# Patient Record
Sex: Female | Born: 1935 | Race: White | Hispanic: No | Smoking: Smoker, current status unknown
Health system: Southern US, Community
[De-identification: ages and names within clinical notes are randomized; demographics above are authoritative.]

## PROBLEM LIST (undated history)

## (undated) DIAGNOSIS — E7800 Pure hypercholesterolemia, unspecified: Secondary | ICD-10-CM

## (undated) DIAGNOSIS — Z720 Tobacco use: Secondary | ICD-10-CM

## (undated) DIAGNOSIS — I517 Cardiomegaly: Secondary | ICD-10-CM

## (undated) DIAGNOSIS — N8189 Other female genital prolapse: Secondary | ICD-10-CM

## (undated) DIAGNOSIS — H409 Unspecified glaucoma: Secondary | ICD-10-CM

## (undated) DIAGNOSIS — L409 Psoriasis, unspecified: Secondary | ICD-10-CM

## (undated) DIAGNOSIS — J449 Chronic obstructive pulmonary disease, unspecified: Secondary | ICD-10-CM

## (undated) DIAGNOSIS — I10 Essential (primary) hypertension: Secondary | ICD-10-CM

## (undated) HISTORY — DX: Essential (primary) hypertension: I10

## (undated) HISTORY — DX: Cardiomegaly: I51.7

## (undated) HISTORY — DX: Chronic obstructive pulmonary disease, unspecified: J44.9

## (undated) HISTORY — DX: Other female genital prolapse: N81.89

## (undated) HISTORY — DX: Psoriasis, unspecified: L40.9

## (undated) HISTORY — DX: Pure hypercholesterolemia, unspecified: E78.00

## (undated) HISTORY — DX: Tobacco use: Z72.0

## (undated) HISTORY — PX: OOPHORECTOMY: SHX86

## (undated) HISTORY — PX: TONSILLECTOMY: SUR1361

---

## 1999-04-13 ENCOUNTER — Other Ambulatory Visit: Admission: RE | Admit: 1999-04-13 | Discharge: 1999-04-13 | Payer: Self-pay | Admitting: Cardiology

## 2000-10-09 ENCOUNTER — Other Ambulatory Visit: Admission: RE | Admit: 2000-10-09 | Discharge: 2000-10-09 | Payer: Self-pay | Admitting: Cardiology

## 2005-12-21 ENCOUNTER — Other Ambulatory Visit: Admission: RE | Admit: 2005-12-21 | Discharge: 2005-12-21 | Payer: Self-pay | Admitting: *Deleted

## 2009-01-29 ENCOUNTER — Encounter: Admission: RE | Admit: 2009-01-29 | Discharge: 2009-01-29 | Payer: Self-pay | Admitting: Cardiology

## 2009-05-03 ENCOUNTER — Encounter: Admission: RE | Admit: 2009-05-03 | Discharge: 2009-05-03 | Payer: Self-pay | Admitting: Obstetrics and Gynecology

## 2009-07-06 ENCOUNTER — Encounter (INDEPENDENT_AMBULATORY_CARE_PROVIDER_SITE_OTHER): Payer: Self-pay | Admitting: Obstetrics and Gynecology

## 2009-07-06 ENCOUNTER — Ambulatory Visit (HOSPITAL_COMMUNITY): Admission: RE | Admit: 2009-07-06 | Discharge: 2009-07-07 | Payer: Self-pay | Admitting: Obstetrics and Gynecology

## 2009-10-28 ENCOUNTER — Ambulatory Visit: Payer: Self-pay | Admitting: Cardiology

## 2010-05-02 ENCOUNTER — Institutional Professional Consult (permissible substitution) (INDEPENDENT_AMBULATORY_CARE_PROVIDER_SITE_OTHER): Payer: Medicare Other | Admitting: Cardiology

## 2010-05-02 DIAGNOSIS — I119 Hypertensive heart disease without heart failure: Secondary | ICD-10-CM

## 2010-05-02 DIAGNOSIS — Z79899 Other long term (current) drug therapy: Secondary | ICD-10-CM

## 2010-05-02 DIAGNOSIS — E7800 Pure hypercholesterolemia, unspecified: Secondary | ICD-10-CM

## 2010-05-04 ENCOUNTER — Other Ambulatory Visit: Payer: Self-pay | Admitting: Cardiology

## 2010-05-04 ENCOUNTER — Ambulatory Visit
Admission: RE | Admit: 2010-05-04 | Discharge: 2010-05-04 | Disposition: A | Discharge status: Home / Self Care | Payer: Medicare Other | Source: Ambulatory Visit | Attending: Cardiology | Admitting: Cardiology

## 2010-05-04 DIAGNOSIS — R05 Cough: Secondary | ICD-10-CM

## 2010-06-15 LAB — COMPREHENSIVE METABOLIC PANEL
AST: 24 U/L (ref 0–37)
Albumin: 3.8 g/dL (ref 3.5–5.2)
BUN: 20 mg/dL (ref 6–23)
CO2: 30 mEq/L (ref 19–32)
Calcium: 9.2 mg/dL (ref 8.4–10.5)
GFR calc Af Amer: >60 mL/min (ref >60)
Glucose, Bld: 80 mg/dL (ref 70–99)
Sodium: 137 mEq/L (ref 135–145)
Total Bilirubin: 0.7 mg/dL (ref 0.3–1.2)
Total Protein: 6.7 g/dL (ref 6.0–8.3)

## 2010-06-15 LAB — CBC
HCT: 28.6 % — ABNORMAL LOW (ref 36.0–46.0)
Hemoglobin: 12.6 g/dL (ref 12.0–15.0)
MCHC: 34.1 g/dL (ref 30.0–36.0)
MCHC: 34.5 g/dL (ref 30.0–36.0)
MCV: 95.1 fL (ref 78.0–100.0)
MCV: 95.3 fL (ref 78.0–100.0)
Platelets: 159 10*3/uL (ref 150–400)
RBC: 3.88 MIL/uL (ref 3.87–5.11)
RDW: 13 % (ref 11.5–15.5)
WBC: 11.3 10*3/uL — ABNORMAL HIGH (ref 4.0–10.5)
WBC: 7.6 10*3/uL (ref 4.0–10.5)

## 2010-06-15 LAB — BASIC METABOLIC PANEL
CO2: 28 mEq/L (ref 19–32)
Calcium: 8.1 mg/dL — ABNORMAL LOW (ref 8.4–10.5)
Chloride: 101 mEq/L (ref 96–112)
Creatinine, Ser: 0.83 mg/dL (ref 0.4–1.2)
GFR calc Af Amer: >60 mL/min (ref >60)
Glucose, Bld: 124 mg/dL — ABNORMAL HIGH (ref 70–99)
Potassium: 3.8 mEq/L (ref 3.5–5.1)
Sodium: 134 mEq/L — ABNORMAL LOW (ref 135–145)

## 2010-06-15 LAB — URINALYSIS, ROUTINE W REFLEX MICROSCOPIC
Glucose, UA: NEGATIVE mg/dL
Protein, ur: NEGATIVE mg/dL
pH: 7 (ref 5.0–8.0)

## 2010-06-15 LAB — URINE MICROSCOPIC-ADD ON

## 2010-06-15 LAB — PROTIME-INR: INR: 1 (ref 0.00–1.49)

## 2010-07-26 ENCOUNTER — Telehealth: Payer: Self-pay | Admitting: Cardiology

## 2010-07-26 NOTE — Telephone Encounter (Signed)
CALL PT ABOUT HER MEDS. CHART PLACED IN BOX.

## 2010-07-27 NOTE — Telephone Encounter (Signed)
Patient currently taking generic maxzide 37.5/25 mg 1/2 daily however it is available from [facility].  Advised that it is ok for her to take the generic dyazide caps 37.5/25 mg 1 daily.  Advised if she developed any lightheadedness or dizziness to let us know.

## 2010-07-28 NOTE — Telephone Encounter (Signed)
Agree with plan 

## 2010-07-28 NOTE — Telephone Encounter (Signed)
Typed available in error, current dose is unavailable

## 2010-08-17 ENCOUNTER — Other Ambulatory Visit: Payer: Self-pay | Admitting: Cardiology

## 2010-08-17 DIAGNOSIS — I10 Essential (primary) hypertension: Secondary | ICD-10-CM

## 2010-08-17 NOTE — Telephone Encounter (Signed)
escribe request  

## 2010-12-15 ENCOUNTER — Encounter: Payer: Self-pay | Admitting: Cardiology

## 2010-12-27 ENCOUNTER — Encounter: Payer: Self-pay | Admitting: Cardiology

## 2010-12-27 ENCOUNTER — Ambulatory Visit (INDEPENDENT_AMBULATORY_CARE_PROVIDER_SITE_OTHER): Payer: Medicare Other | Admitting: Cardiology

## 2010-12-27 VITALS — BP 137/77 | HR 64 | Ht 61.0 in | Wt 115.0 lb

## 2010-12-27 DIAGNOSIS — Z716 Tobacco abuse counseling: Secondary | ICD-10-CM

## 2010-12-27 DIAGNOSIS — R002 Palpitations: Secondary | ICD-10-CM

## 2010-12-27 DIAGNOSIS — F17200 Nicotine dependence, unspecified, uncomplicated: Secondary | ICD-10-CM

## 2010-12-27 DIAGNOSIS — L409 Psoriasis, unspecified: Secondary | ICD-10-CM

## 2010-12-27 DIAGNOSIS — E7800 Pure hypercholesterolemia, unspecified: Secondary | ICD-10-CM

## 2010-12-27 DIAGNOSIS — Z720 Tobacco use: Secondary | ICD-10-CM

## 2010-12-27 DIAGNOSIS — Z7189 Other specified counseling: Secondary | ICD-10-CM

## 2010-12-27 DIAGNOSIS — L408 Other psoriasis: Secondary | ICD-10-CM

## 2010-12-27 DIAGNOSIS — I119 Hypertensive heart disease without heart failure: Secondary | ICD-10-CM

## 2010-12-27 LAB — LIPID PANEL: HDL: 73.5 mg/dL (ref >39.00)

## 2010-12-27 LAB — BASIC METABOLIC PANEL
BUN: 19 mg/dL (ref 6–23)
CO2: 31 mEq/L (ref 19–32)
Calcium: 9.4 mg/dL (ref 8.4–10.5)
Chloride: 101 mEq/L (ref 96–112)
GFR: 60.64 mL/min (ref >60.00)

## 2010-12-27 LAB — HEPATIC FUNCTION PANEL
Albumin: 4 g/dL (ref 3.5–5.2)
Alkaline Phosphatase: 47 U/L (ref 39–117)
Bilirubin, Direct: 0.1 mg/dL (ref 0.0–0.3)
Total Bilirubin: 0.7 mg/dL (ref 0.3–1.2)
Total Protein: 6.8 g/dL (ref 6.0–8.3)

## 2010-12-27 NOTE — Progress Notes (Signed)
Jenny Sexton Date of Birth:  [birth year] [facility] / [employer] [address]   [address] [place], [place]  [address] [phone]           Fax   [phone]  History of Present Illness: This pleasant 77 year old woman is seen for a scheduled six-month followup office visit.  Past history of essential hypertension, ongoing cigarette abuse, and occasional noncardiac chest pain.  She also has a history of psoriasis and hypercholesterolemia.  She does not have any known ischemic heart disease, and she had a normal Cardiolite stress test in January 2001.  Current Outpatient Prescriptions  Medication Sig Dispense Refill  . aspirin 81 MG tablet Take 81 mg by mouth daily. 3 TIMES A WEEK       . atenolol (TENORMIN) 50 MG tablet TAKE 1 TABLET DAILY  90 tablet  3  . atorvastatin (LIPITOR) 10 MG tablet Take 10 mg by mouth daily.        . brimonidine (ALPHAGAN P) 0.1 % SOLN 1 drop both eyes twice a day       . Calcium Carbonate-Vitamin D (CALTRATE 600+D PO) Take by mouth.        . dorzolamide-timolol (COSOPT) 22.3-6.8 MG/ML ophthalmic solution 1 drop 2 (two) times daily.        . Travoprost, BAK Free, (TRAVATAMN) 0.004 % SOLN ophthalmic solution 1 drop at bedtime.        . triamterene-hydrochlorothiazide (DYAZIDE) 37.5-25 MG per capsule 1 tab daily        Allergies  Allergen Reactions  . Amoxicillin     There is no problem list on file for this patient.   History  Smoking status  . Smoker, Current Status Unknown -- 1.0 packs/day  Smokeless tobacco  . Not on file    History  Alcohol Use     Family History  Problem Relation Age of Onset  . Heart attack Father   . Cancer Mother     Review of Systems: Constitutional: no fever chills diaphoresis or fatigue or change in weight.  Head and neck: no hearing loss, no epistaxis, no photophobia or visual disturbance. Respiratory: No cough, shortness of breath or wheezing. Cardiovascular: No chest pain peripheral edema,  palpitations. Gastrointestinal: No abdominal distention, no abdominal pain, no change in bowel habits hematochezia or melena. Genitourinary: No dysuria, no frequency, no urgency, no nocturia. Musculoskeletal:No arthralgias, no back pain, no gait disturbance or myalgias. Neurological: No dizziness, no headaches, no numbness, no seizures, no syncope, no weakness, no tremors. Hematologic: No lymphadenopathy, no easy bruising. Psychiatric: No confusion, no hallucinations, no sleep disturbance.    Physical Exam: Filed Vitals:   12/27/10 0947  BP: 137/77  Pulse: 64   general appearance reveals a well-developed, well-nourished woman in no acute distress.Pupils equal and reactive.   Extraocular Movements are full.  There is no scleral icterus.  The mouth and pharynx are normal.  The neck is supple.  The carotids reveal no bruits.  The jugular venous pressure is normal.  The thyroid is not enlarged.  There is no lymphadenopathy.  The chest is clear to percussion and auscultation. There are no rales or rhonchi. Expansion of the chest is symmetrical.  The precordium is quiet.  The first heart sound is normal.  The second heart sound is physiologically split.  There is no murmur gallop rub or click.  There is no abnormal lift or heave.  The abdomen is soft and nontender. Bowel sounds are normal. The liver  and spleen are not enlarged. There Are no abdominal masses. There are no bruits.  The pedal pulses are good.  There is no phlebitis or edema.  There is no cyanosis or clubbing. Integument reveals changes of psoriasis, which are stableStrength is normal and symmetrical in all extremities.  There is no lateralizing weakness.  There are no sensory deficits.     Assessment / Plan:  Continue same medication.  Blood work today pending.  Recheck in 6 months for followup office visit and EKG and fasting lab work

## 2010-12-27 NOTE — Assessment & Plan Note (Signed)
Patient is tolerating low dose  10 milligrams daily.  She is not having myalgias from the statin therapy.  We are checking lab work today

## 2010-12-27 NOTE — Assessment & Plan Note (Signed)
Since last visit, the patient has been feeling well.  She has not been experiencing any headaches or dizzy spells.  She denies any chest pain or shortness of breath.  Is not on any palpitations.

## 2010-12-27 NOTE — Assessment & Plan Note (Signed)
We talked to her today about the importance of quitting smoking.  She has cut down slightly, but has still not made a severe effort to quit.  He does have a slight morning smoker's cough, which is nonproductive.  Her last chest x-ray was 05/04/10 showing stable.  Emphysematous changes and small calcified granulomas, but no acute pulmonary findings and her cardiac silhouette was normal

## 2010-12-27 NOTE — Patient Instructions (Addendum)
We will check your cholesterol and your liver function studies and your electrolytes, today.  I recommend that you obtain some Biotin over the counter to take for hair loss  Need to quit smoking

## 2010-12-29 ENCOUNTER — Telehealth: Payer: Self-pay | Admitting: *Deleted

## 2010-12-29 NOTE — Telephone Encounter (Signed)
Advised of labs 

## 2010-12-29 NOTE — Progress Notes (Signed)
Advised patient

## 2010-12-29 NOTE — Telephone Encounter (Signed)
Message copied by Burnell Blanks on Thu Dec 29, 2010  4:54 PM ------      Message from: Cassell Clement      Created: Thu Dec 29, 2010  3:28 PM       Please report. LFTs nl.  BS 104 better.      LP normal.  CSD

## 2011-04-10 DIAGNOSIS — Z1231 Encounter for screening mammogram for malignant neoplasm of breast: Secondary | ICD-10-CM | POA: Diagnosis not present

## 2011-04-27 ENCOUNTER — Other Ambulatory Visit: Payer: Self-pay | Admitting: *Deleted

## 2011-04-27 MED ORDER — ATORVASTATIN CALCIUM 10 MG PO TABS: 10.0000 mg | ORAL_TABLET | Freq: Every day | ORAL | Status: DC

## 2011-04-28 ENCOUNTER — Ambulatory Visit (INDEPENDENT_AMBULATORY_CARE_PROVIDER_SITE_OTHER): Payer: Medicare Other | Admitting: *Deleted

## 2011-04-28 DIAGNOSIS — I119 Hypertensive heart disease without heart failure: Secondary | ICD-10-CM | POA: Diagnosis not present

## 2011-04-28 DIAGNOSIS — E7800 Pure hypercholesterolemia, unspecified: Secondary | ICD-10-CM | POA: Diagnosis not present

## 2011-04-28 LAB — CBC WITH DIFFERENTIAL/PLATELET
Basophils Absolute: 0 10*3/uL (ref 0.0–0.1)
Basophils Relative: 0.6 % (ref 0.0–3.0)
Eosinophils Relative: 4.2 % (ref 0.0–5.0)
HCT: 35.2 % — ABNORMAL LOW (ref 36.0–46.0)
Hemoglobin: 12 g/dL (ref 12.0–15.0)
Lymphs Abs: 1.5 10*3/uL (ref 0.7–4.0)
MCHC: 34.2 g/dL (ref 30.0–36.0)
Monocytes Absolute: 0.6 10*3/uL (ref 0.1–1.0)
Neutro Abs: 3.6 10*3/uL (ref 1.4–7.7)
Platelets: 232 10*3/uL (ref 150.0–400.0)
WBC: 6 10*3/uL (ref 4.5–10.5)

## 2011-04-28 LAB — BASIC METABOLIC PANEL
Calcium: 9.2 mg/dL (ref 8.4–10.5)
GFR: 58.45 mL/min — ABNORMAL LOW (ref >60.00)
Potassium: 3.7 mEq/L (ref 3.5–5.1)
Sodium: 137 mEq/L (ref 135–145)

## 2011-04-28 LAB — HEPATIC FUNCTION PANEL
AST: 25 U/L (ref 0–37)
Albumin: 3.8 g/dL (ref 3.5–5.2)
Bilirubin, Direct: 0 mg/dL (ref 0.0–0.3)
Total Bilirubin: 0.6 mg/dL (ref 0.3–1.2)

## 2011-04-28 LAB — LIPID PANEL
HDL: 57.1 mg/dL (ref >39.00)
LDL Cholesterol: 76 mg/dL (ref 0–99)
Total CHOL/HDL Ratio: 3
Triglycerides: 113 mg/dL (ref 0.0–149.0)

## 2011-05-01 ENCOUNTER — Telehealth: Payer: Self-pay | Admitting: *Deleted

## 2011-05-01 NOTE — Telephone Encounter (Signed)
Advised of labs 

## 2011-05-01 NOTE — Telephone Encounter (Signed)
Message copied by Burnell Blanks on Mon May 01, 2011  9:17 AM ------      Message from: Cassell Clement      Created: Mon May 01, 2011  6:04 AM       Please report.  The labs are stable.  Continue same meds.  Continue careful diet.

## 2011-05-18 DIAGNOSIS — N812 Incomplete uterovaginal prolapse: Secondary | ICD-10-CM | POA: Diagnosis not present

## 2011-06-21 ENCOUNTER — Other Ambulatory Visit: Payer: Self-pay | Admitting: *Deleted

## 2011-06-21 DIAGNOSIS — I10 Essential (primary) hypertension: Secondary | ICD-10-CM

## 2011-06-21 MED ORDER — TRIAMTERENE-HCTZ 37.5-25 MG PO CAPS: 1.0000 | ORAL_CAPSULE | Freq: Every day | ORAL | Status: DC

## 2011-06-21 MED ORDER — ATENOLOL 50 MG PO TABS: 50.0000 mg | ORAL_TABLET | Freq: Every day | ORAL | Status: DC

## 2011-06-26 ENCOUNTER — Other Ambulatory Visit: Payer: Self-pay | Admitting: *Deleted

## 2011-07-10 DIAGNOSIS — H2510 Age-related nuclear cataract, unspecified eye: Secondary | ICD-10-CM | POA: Diagnosis not present

## 2011-07-10 DIAGNOSIS — H4011X0 Primary open-angle glaucoma, stage unspecified: Secondary | ICD-10-CM | POA: Diagnosis not present

## 2011-07-13 ENCOUNTER — Encounter: Payer: Self-pay | Admitting: Cardiology

## 2011-07-13 ENCOUNTER — Ambulatory Visit (INDEPENDENT_AMBULATORY_CARE_PROVIDER_SITE_OTHER): Payer: Medicare Other | Admitting: Cardiology

## 2011-07-13 ENCOUNTER — Ambulatory Visit (INDEPENDENT_AMBULATORY_CARE_PROVIDER_SITE_OTHER)
Admission: RE | Admit: 2011-07-13 | Discharge: 2011-07-13 | Disposition: A | Discharge status: Home / Self Care | Payer: Medicare Other | Source: Ambulatory Visit | Attending: Cardiology | Admitting: Cardiology

## 2011-07-13 VITALS — BP 120/82 | HR 55 | Ht 62.0 in | Wt 116.0 lb

## 2011-07-13 DIAGNOSIS — R05 Cough: Secondary | ICD-10-CM

## 2011-07-13 DIAGNOSIS — E7800 Pure hypercholesterolemia, unspecified: Secondary | ICD-10-CM | POA: Diagnosis not present

## 2011-07-13 DIAGNOSIS — I519 Heart disease, unspecified: Secondary | ICD-10-CM | POA: Diagnosis not present

## 2011-07-13 DIAGNOSIS — Z720 Tobacco use: Secondary | ICD-10-CM

## 2011-07-13 DIAGNOSIS — I10 Essential (primary) hypertension: Secondary | ICD-10-CM | POA: Diagnosis not present

## 2011-07-13 DIAGNOSIS — F17200 Nicotine dependence, unspecified, uncomplicated: Secondary | ICD-10-CM

## 2011-07-13 DIAGNOSIS — F411 Generalized anxiety disorder: Secondary | ICD-10-CM

## 2011-07-13 DIAGNOSIS — I119 Hypertensive heart disease without heart failure: Secondary | ICD-10-CM

## 2011-07-13 DIAGNOSIS — R0602 Shortness of breath: Secondary | ICD-10-CM | POA: Diagnosis not present

## 2011-07-13 DIAGNOSIS — F419 Anxiety disorder, unspecified: Secondary | ICD-10-CM

## 2011-07-13 MED ORDER — ALPRAZOLAM 0.25 MG PO TABS: 0.2500 mg | ORAL_TABLET | Freq: Two times a day (BID) | ORAL | Status: AC | PRN

## 2011-07-13 NOTE — Assessment & Plan Note (Signed)
The patient admits to being under a lot of emotional stress.  She would like to quit smoking.  She is requesting something mild for her nerves and we will give her a trial of Xanax 0.25 mg twice a day when necessary.

## 2011-07-13 NOTE — Patient Instructions (Signed)
Rx for Xanax called to [facility]-- one tablet twice a day as needed  Will have you go for chest xray and call you with the results  Your physician wants you to follow-up in: 6 monts You will receive a reminder letter in the mail two months in advance. If you don't receive a letter, please call our office to schedule the follow-up appointment.

## 2011-07-13 NOTE — Assessment & Plan Note (Signed)
The patient is on Lipitor 10 mg daily for her hypercholesterolemia.  Her lipids are remaining satisfactory.  She has not been expressing any myalgias.

## 2011-07-13 NOTE — Assessment & Plan Note (Signed)
Blood pressure has been staying stable on current therapy.  The patient does have exertional dyspnea which she attributes to smoking.  She also has a nonproductive cough.  We will update her chest x-ray today.

## 2011-07-13 NOTE — Progress Notes (Signed)
Jenny Sexton Date of Birth:  1932-02-04 [facility] [address] [place], [place]  [address] [phone]         Fax   [phone]  History of Present Illness: This pleasant 78 year old woman is seen for a six-month followup office visit.  She has a history of essential hypertension, hypercholesterolemia, and atypical chest pain.  She had a normal Cardiolite stress test in January 2001.  He has a chronic cough and has ongoing cigarette abuse.  As a history of psoriasis.  Since last visit she's been under a lot of stress.  Her husband who is 10 years older than she is has had a lot of serious medical issues and she is his caregiver.  Current Outpatient Prescriptions  Medication Sig Dispense Refill  . aspirin 81 MG tablet Take 81 mg by mouth.       Marland Kitchen atenolol (TENORMIN) 50 MG tablet Take 1 tablet (50 mg total) by mouth daily.  90 tablet  2  . atorvastatin (LIPITOR) 10 MG tablet Take 1 tablet (10 mg total) by mouth daily.  90 tablet  3  . brimonidine (ALPHAGAN P) 0.1 % SOLN 1 drop both eyes twice a day       . Calcium Carbonate-Vitamin D (CALTRATE 600+D PO) Take by mouth.        . dorzolamide-timolol (COSOPT) 22.3-6.8 MG/ML ophthalmic solution 1 drop 2 (two) times daily.        . Travoprost, BAK Free, (TRAVATAMN) 0.004 % SOLN ophthalmic solution 1 drop at bedtime.        . triamterene-hydrochlorothiazide (DYAZIDE) 37.5-25 MG per capsule Take 1 each (1 capsule total) by mouth daily. 1 tab daily  90 capsule  2  . ALPRAZolam (XANAX) 0.25 MG tablet Take 1 tablet (0.25 mg total) by mouth 2 (two) times daily as needed.  60 tablet  1  . doxycycline (PERIOSTAT) 20 MG tablet as directed.        Allergies  Allergen Reactions  . Amoxicillin   . Codeine     nausea    Patient Active Problem List  Diagnoses  . Hypercholesterolemia  . Benign hypertensive heart disease without heart failure  . Psoriasis  . Tobacco abuse    History  Smoking status  . Smoker, Current  Status Unknown -- 1.0 packs/day  Smokeless tobacco  . Not on file    History  Alcohol Use     Family History  Problem Relation Age of Onset  . Heart attack Father   . Cancer Mother     Review of Systems: Constitutional: no fever chills diaphoresis or fatigue or change in weight.  Head and neck: no hearing loss, no epistaxis, no photophobia or visual disturbance. Respiratory: No cough, shortness of breath or wheezing. Cardiovascular: No chest pain peripheral edema, palpitations. Gastrointestinal: No abdominal distention, no abdominal pain, no change in bowel habits hematochezia or melena. Genitourinary: No dysuria, no frequency, no urgency, no nocturia. Musculoskeletal:No arthralgias, no back pain, no gait disturbance or myalgias. Neurological: No dizziness, no headaches, no numbness, no seizures, no syncope, no weakness, no tremors. Hematologic: No lymphadenopathy, no easy bruising. Psychiatric: No confusion, no hallucinations, no sleep disturbance.    Physical Exam: Filed Vitals:   07/13/11 1022  BP: 120/82  Pulse: 55   the general appearance reveals a well-developed well-nourished woman in no distress.Pupils equal and reactive.   Extraocular Movements are full.  There is no scleral icterus.  The mouth and pharynx are normal.  The  neck is supple.  The carotids reveal no bruits.  The jugular venous pressure is normal.  The thyroid is not enlarged.  There is no lymphadenopathy.  The chest is clear to percussion and auscultation.  She does have some mild expiratory rhonchi and wheezes.The precordium is quiet.  The first heart sound is normal.  The second heart sound is physiologically split.  There is no murmur gallop rub or click.  There is no abnormal lift or heave.  The abdomen is soft and nontender. Bowel sounds are normal. The liver and spleen are not enlarged. There Are no abdominal masses. There are no bruits.  The pedal pulses are good.  There is no phlebitis or edema.   There is no cyanosis or clubbing. Strength is normal and symmetrical in all extremities.  There is no lateralizing weakness.  There are no sensory deficits.  Integument reveals skin changes of psoriasis  EKG today shows sinus bradycardia and is otherwise within normal limits  Assessment / Plan: Continue same medication.  Trial of Xanax.  We spoke to her about the need to stop smoking.  She will get chest x-ray today.  She will be seeing Dr. Timothy Lasso for her general medical care.  We will see her back here in 6 months for followup office visit and follow her lipid panel hepatic function panel and basal metabolic panel at that time

## 2011-07-14 ENCOUNTER — Telehealth: Payer: Self-pay | Admitting: *Deleted

## 2011-07-14 NOTE — Telephone Encounter (Signed)
Advised of labs 

## 2011-07-14 NOTE — Telephone Encounter (Signed)
Message copied by Burnell Blanks on Fri Jul 14, 2011  8:49 AM ------      Message from: Cassell Clement      Created: Thu Jul 13, 2011  5:47 PM       Please report.  The chest xray shows COPD again. No change from last year.

## 2011-08-23 DIAGNOSIS — N390 Urinary tract infection, site not specified: Secondary | ICD-10-CM | POA: Diagnosis not present

## 2011-08-23 DIAGNOSIS — N812 Incomplete uterovaginal prolapse: Secondary | ICD-10-CM | POA: Diagnosis not present

## 2011-09-26 DIAGNOSIS — J449 Chronic obstructive pulmonary disease, unspecified: Secondary | ICD-10-CM | POA: Diagnosis not present

## 2011-09-26 DIAGNOSIS — D649 Anemia, unspecified: Secondary | ICD-10-CM | POA: Diagnosis not present

## 2011-09-26 DIAGNOSIS — K4190 Unilateral femoral hernia, without obstruction or gangrene, not specified as recurrent: Secondary | ICD-10-CM | POA: Diagnosis not present

## 2011-09-26 DIAGNOSIS — I10 Essential (primary) hypertension: Secondary | ICD-10-CM | POA: Diagnosis not present

## 2011-09-26 DIAGNOSIS — E785 Hyperlipidemia, unspecified: Secondary | ICD-10-CM | POA: Diagnosis not present

## 2011-12-04 DIAGNOSIS — Z23 Encounter for immunization: Secondary | ICD-10-CM | POA: Diagnosis not present

## 2011-12-21 DIAGNOSIS — N812 Incomplete uterovaginal prolapse: Secondary | ICD-10-CM | POA: Diagnosis not present

## 2012-02-21 DIAGNOSIS — H4011X0 Primary open-angle glaucoma, stage unspecified: Secondary | ICD-10-CM | POA: Diagnosis not present

## 2012-02-21 DIAGNOSIS — H2510 Age-related nuclear cataract, unspecified eye: Secondary | ICD-10-CM | POA: Diagnosis not present

## 2012-04-16 DIAGNOSIS — N812 Incomplete uterovaginal prolapse: Secondary | ICD-10-CM | POA: Diagnosis not present

## 2012-04-16 DIAGNOSIS — Z1231 Encounter for screening mammogram for malignant neoplasm of breast: Secondary | ICD-10-CM | POA: Diagnosis not present

## 2012-04-24 DIAGNOSIS — Z1211 Encounter for screening for malignant neoplasm of colon: Secondary | ICD-10-CM | POA: Diagnosis not present

## 2012-07-23 DIAGNOSIS — N812 Incomplete uterovaginal prolapse: Secondary | ICD-10-CM | POA: Diagnosis not present

## 2012-07-23 DIAGNOSIS — M899 Disorder of bone, unspecified: Secondary | ICD-10-CM | POA: Diagnosis not present

## 2012-07-23 DIAGNOSIS — J069 Acute upper respiratory infection, unspecified: Secondary | ICD-10-CM | POA: Diagnosis not present

## 2012-07-23 DIAGNOSIS — R05 Cough: Secondary | ICD-10-CM | POA: Diagnosis not present

## 2012-07-23 DIAGNOSIS — I10 Essential (primary) hypertension: Secondary | ICD-10-CM | POA: Diagnosis not present

## 2012-07-23 DIAGNOSIS — R7309 Other abnormal glucose: Secondary | ICD-10-CM | POA: Diagnosis not present

## 2012-07-23 DIAGNOSIS — IMO0002 Reserved for concepts with insufficient information to code with codable children: Secondary | ICD-10-CM | POA: Diagnosis not present

## 2012-07-23 DIAGNOSIS — R82998 Other abnormal findings in urine: Secondary | ICD-10-CM | POA: Diagnosis not present

## 2012-07-23 DIAGNOSIS — J029 Acute pharyngitis, unspecified: Secondary | ICD-10-CM | POA: Diagnosis not present

## 2012-07-23 DIAGNOSIS — E785 Hyperlipidemia, unspecified: Secondary | ICD-10-CM | POA: Diagnosis not present

## 2012-07-24 DIAGNOSIS — R7309 Other abnormal glucose: Secondary | ICD-10-CM | POA: Diagnosis not present

## 2012-07-30 DIAGNOSIS — D649 Anemia, unspecified: Secondary | ICD-10-CM | POA: Diagnosis not present

## 2012-07-30 DIAGNOSIS — J069 Acute upper respiratory infection, unspecified: Secondary | ICD-10-CM | POA: Diagnosis not present

## 2012-07-30 DIAGNOSIS — K4190 Unilateral femoral hernia, without obstruction or gangrene, not specified as recurrent: Secondary | ICD-10-CM | POA: Diagnosis not present

## 2012-07-30 DIAGNOSIS — Z0000 Encounter for general adult medical examination without abnormal findings: Secondary | ICD-10-CM | POA: Diagnosis not present

## 2012-07-30 DIAGNOSIS — L408 Other psoriasis: Secondary | ICD-10-CM | POA: Diagnosis not present

## 2012-07-30 DIAGNOSIS — M899 Disorder of bone, unspecified: Secondary | ICD-10-CM | POA: Diagnosis not present

## 2012-07-30 DIAGNOSIS — Z23 Encounter for immunization: Secondary | ICD-10-CM | POA: Diagnosis not present

## 2012-07-30 DIAGNOSIS — R7309 Other abnormal glucose: Secondary | ICD-10-CM | POA: Diagnosis not present

## 2012-07-30 DIAGNOSIS — F17200 Nicotine dependence, unspecified, uncomplicated: Secondary | ICD-10-CM | POA: Diagnosis not present

## 2012-08-26 DIAGNOSIS — H4011X0 Primary open-angle glaucoma, stage unspecified: Secondary | ICD-10-CM | POA: Diagnosis not present

## 2012-08-26 DIAGNOSIS — H2510 Age-related nuclear cataract, unspecified eye: Secondary | ICD-10-CM | POA: Diagnosis not present

## 2012-11-27 DIAGNOSIS — N760 Acute vaginitis: Secondary | ICD-10-CM | POA: Diagnosis not present

## 2012-11-27 DIAGNOSIS — N8110 Cystocele, unspecified: Secondary | ICD-10-CM | POA: Diagnosis not present

## 2012-12-23 DIAGNOSIS — Z23 Encounter for immunization: Secondary | ICD-10-CM | POA: Diagnosis not present

## 2013-01-06 DIAGNOSIS — H2510 Age-related nuclear cataract, unspecified eye: Secondary | ICD-10-CM | POA: Diagnosis not present

## 2013-01-06 DIAGNOSIS — H409 Unspecified glaucoma: Secondary | ICD-10-CM | POA: Diagnosis not present

## 2013-01-06 DIAGNOSIS — H4011X0 Primary open-angle glaucoma, stage unspecified: Secondary | ICD-10-CM | POA: Diagnosis not present

## 2013-01-27 DIAGNOSIS — R809 Proteinuria, unspecified: Secondary | ICD-10-CM | POA: Diagnosis not present

## 2013-01-27 DIAGNOSIS — I10 Essential (primary) hypertension: Secondary | ICD-10-CM | POA: Diagnosis not present

## 2013-01-27 DIAGNOSIS — F17200 Nicotine dependence, unspecified, uncomplicated: Secondary | ICD-10-CM | POA: Diagnosis not present

## 2013-01-27 DIAGNOSIS — IMO0002 Reserved for concepts with insufficient information to code with codable children: Secondary | ICD-10-CM | POA: Diagnosis not present

## 2013-01-27 DIAGNOSIS — E785 Hyperlipidemia, unspecified: Secondary | ICD-10-CM | POA: Diagnosis not present

## 2013-01-27 DIAGNOSIS — M899 Disorder of bone, unspecified: Secondary | ICD-10-CM | POA: Diagnosis not present

## 2013-01-27 DIAGNOSIS — D126 Benign neoplasm of colon, unspecified: Secondary | ICD-10-CM | POA: Diagnosis not present

## 2013-01-27 DIAGNOSIS — J449 Chronic obstructive pulmonary disease, unspecified: Secondary | ICD-10-CM | POA: Diagnosis not present

## 2013-02-14 ENCOUNTER — Encounter: Payer: Self-pay | Admitting: Cardiology

## 2013-02-14 ENCOUNTER — Other Ambulatory Visit: Payer: Self-pay

## 2013-05-12 DIAGNOSIS — H4011X0 Primary open-angle glaucoma, stage unspecified: Secondary | ICD-10-CM | POA: Diagnosis not present

## 2013-05-12 DIAGNOSIS — H409 Unspecified glaucoma: Secondary | ICD-10-CM | POA: Diagnosis not present

## 2013-06-03 DIAGNOSIS — N950 Postmenopausal bleeding: Secondary | ICD-10-CM | POA: Diagnosis not present

## 2013-06-03 DIAGNOSIS — N760 Acute vaginitis: Secondary | ICD-10-CM | POA: Diagnosis not present

## 2013-06-11 DIAGNOSIS — Z1211 Encounter for screening for malignant neoplasm of colon: Secondary | ICD-10-CM | POA: Diagnosis not present

## 2013-06-11 DIAGNOSIS — Z1231 Encounter for screening mammogram for malignant neoplasm of breast: Secondary | ICD-10-CM | POA: Diagnosis not present

## 2013-06-16 DIAGNOSIS — Z800 Family history of malignant neoplasm of digestive organs: Secondary | ICD-10-CM | POA: Diagnosis not present

## 2013-06-16 DIAGNOSIS — K921 Melena: Secondary | ICD-10-CM | POA: Diagnosis not present

## 2013-06-16 DIAGNOSIS — Z86010 Personal history of colonic polyps: Secondary | ICD-10-CM | POA: Diagnosis not present

## 2013-07-01 DIAGNOSIS — N950 Postmenopausal bleeding: Secondary | ICD-10-CM | POA: Diagnosis not present

## 2013-07-02 DIAGNOSIS — L408 Other psoriasis: Secondary | ICD-10-CM | POA: Diagnosis not present

## 2013-07-02 DIAGNOSIS — L821 Other seborrheic keratosis: Secondary | ICD-10-CM | POA: Diagnosis not present

## 2013-07-02 DIAGNOSIS — H61009 Unspecified perichondritis of external ear, unspecified ear: Secondary | ICD-10-CM | POA: Diagnosis not present

## 2013-07-23 ENCOUNTER — Other Ambulatory Visit: Payer: Self-pay | Admitting: Gastroenterology

## 2013-07-23 DIAGNOSIS — K5730 Diverticulosis of large intestine without perforation or abscess without bleeding: Secondary | ICD-10-CM | POA: Diagnosis not present

## 2013-07-23 DIAGNOSIS — Z09 Encounter for follow-up examination after completed treatment for conditions other than malignant neoplasm: Secondary | ICD-10-CM | POA: Diagnosis not present

## 2013-07-23 DIAGNOSIS — D126 Benign neoplasm of colon, unspecified: Secondary | ICD-10-CM | POA: Diagnosis not present

## 2013-07-23 DIAGNOSIS — Z86010 Personal history of colonic polyps: Secondary | ICD-10-CM | POA: Diagnosis not present

## 2013-07-28 DIAGNOSIS — R809 Proteinuria, unspecified: Secondary | ICD-10-CM | POA: Diagnosis not present

## 2013-07-28 DIAGNOSIS — R7309 Other abnormal glucose: Secondary | ICD-10-CM | POA: Diagnosis not present

## 2013-07-28 DIAGNOSIS — E785 Hyperlipidemia, unspecified: Secondary | ICD-10-CM | POA: Diagnosis not present

## 2013-07-28 DIAGNOSIS — M899 Disorder of bone, unspecified: Secondary | ICD-10-CM | POA: Diagnosis not present

## 2013-07-28 DIAGNOSIS — M949 Disorder of cartilage, unspecified: Secondary | ICD-10-CM | POA: Diagnosis not present

## 2013-07-28 DIAGNOSIS — R82998 Other abnormal findings in urine: Secondary | ICD-10-CM | POA: Diagnosis not present

## 2013-07-28 DIAGNOSIS — I10 Essential (primary) hypertension: Secondary | ICD-10-CM | POA: Diagnosis not present

## 2013-08-04 DIAGNOSIS — R7309 Other abnormal glucose: Secondary | ICD-10-CM | POA: Diagnosis not present

## 2013-08-04 DIAGNOSIS — M899 Disorder of bone, unspecified: Secondary | ICD-10-CM | POA: Diagnosis not present

## 2013-08-04 DIAGNOSIS — D126 Benign neoplasm of colon, unspecified: Secondary | ICD-10-CM | POA: Diagnosis not present

## 2013-08-04 DIAGNOSIS — Z23 Encounter for immunization: Secondary | ICD-10-CM | POA: Diagnosis not present

## 2013-08-04 DIAGNOSIS — R3129 Other microscopic hematuria: Secondary | ICD-10-CM | POA: Diagnosis not present

## 2013-08-04 DIAGNOSIS — F17200 Nicotine dependence, unspecified, uncomplicated: Secondary | ICD-10-CM | POA: Diagnosis not present

## 2013-08-04 DIAGNOSIS — I10 Essential (primary) hypertension: Secondary | ICD-10-CM | POA: Diagnosis not present

## 2013-08-04 DIAGNOSIS — M949 Disorder of cartilage, unspecified: Secondary | ICD-10-CM | POA: Diagnosis not present

## 2013-08-04 DIAGNOSIS — Z0000 Encounter for general adult medical examination without abnormal findings: Secondary | ICD-10-CM | POA: Diagnosis not present

## 2013-08-04 DIAGNOSIS — Z1331 Encounter for screening for depression: Secondary | ICD-10-CM | POA: Diagnosis not present

## 2013-08-04 DIAGNOSIS — D649 Anemia, unspecified: Secondary | ICD-10-CM | POA: Diagnosis not present

## 2013-09-10 DIAGNOSIS — M949 Disorder of cartilage, unspecified: Secondary | ICD-10-CM | POA: Diagnosis not present

## 2013-09-10 DIAGNOSIS — M899 Disorder of bone, unspecified: Secondary | ICD-10-CM | POA: Diagnosis not present

## 2013-09-17 DIAGNOSIS — H4011X0 Primary open-angle glaucoma, stage unspecified: Secondary | ICD-10-CM | POA: Diagnosis not present

## 2013-09-17 DIAGNOSIS — H409 Unspecified glaucoma: Secondary | ICD-10-CM | POA: Diagnosis not present

## 2013-09-17 DIAGNOSIS — H2510 Age-related nuclear cataract, unspecified eye: Secondary | ICD-10-CM | POA: Diagnosis not present

## 2013-11-11 DIAGNOSIS — N8110 Cystocele, unspecified: Secondary | ICD-10-CM | POA: Diagnosis not present

## 2013-12-23 DIAGNOSIS — IMO0002 Reserved for concepts with insufficient information to code with codable children: Secondary | ICD-10-CM | POA: Diagnosis not present

## 2013-12-23 DIAGNOSIS — M810 Age-related osteoporosis without current pathological fracture: Secondary | ICD-10-CM | POA: Diagnosis not present

## 2014-01-05 DIAGNOSIS — Z23 Encounter for immunization: Secondary | ICD-10-CM | POA: Diagnosis not present

## 2014-02-09 DIAGNOSIS — E46 Unspecified protein-calorie malnutrition: Secondary | ICD-10-CM | POA: Diagnosis not present

## 2014-02-09 DIAGNOSIS — E785 Hyperlipidemia, unspecified: Secondary | ICD-10-CM | POA: Diagnosis not present

## 2014-02-09 DIAGNOSIS — M859 Disorder of bone density and structure, unspecified: Secondary | ICD-10-CM | POA: Diagnosis not present

## 2014-02-09 DIAGNOSIS — J449 Chronic obstructive pulmonary disease, unspecified: Secondary | ICD-10-CM | POA: Diagnosis not present

## 2014-02-09 DIAGNOSIS — I10 Essential (primary) hypertension: Secondary | ICD-10-CM | POA: Diagnosis not present

## 2014-02-09 DIAGNOSIS — F17210 Nicotine dependence, cigarettes, uncomplicated: Secondary | ICD-10-CM | POA: Diagnosis not present

## 2014-02-09 DIAGNOSIS — Z681 Body mass index (BMI) 19 or less, adult: Secondary | ICD-10-CM | POA: Diagnosis not present

## 2014-02-09 DIAGNOSIS — R634 Abnormal weight loss: Secondary | ICD-10-CM | POA: Diagnosis not present

## 2014-02-14 DIAGNOSIS — H4011X3 Primary open-angle glaucoma, severe stage: Secondary | ICD-10-CM | POA: Diagnosis not present

## 2014-02-14 DIAGNOSIS — H2513 Age-related nuclear cataract, bilateral: Secondary | ICD-10-CM | POA: Diagnosis not present

## 2014-03-30 DIAGNOSIS — H2513 Age-related nuclear cataract, bilateral: Secondary | ICD-10-CM | POA: Diagnosis not present

## 2014-03-30 DIAGNOSIS — H4011X3 Primary open-angle glaucoma, severe stage: Secondary | ICD-10-CM | POA: Diagnosis not present

## 2014-04-14 DIAGNOSIS — N812 Incomplete uterovaginal prolapse: Secondary | ICD-10-CM | POA: Diagnosis not present

## 2014-08-03 DIAGNOSIS — N390 Urinary tract infection, site not specified: Secondary | ICD-10-CM | POA: Diagnosis not present

## 2014-08-03 DIAGNOSIS — M859 Disorder of bone density and structure, unspecified: Secondary | ICD-10-CM | POA: Diagnosis not present

## 2014-08-03 DIAGNOSIS — E785 Hyperlipidemia, unspecified: Secondary | ICD-10-CM | POA: Diagnosis not present

## 2014-08-03 DIAGNOSIS — I10 Essential (primary) hypertension: Secondary | ICD-10-CM | POA: Diagnosis not present

## 2014-08-03 DIAGNOSIS — Z0000 Encounter for general adult medical examination without abnormal findings: Secondary | ICD-10-CM | POA: Diagnosis not present

## 2014-08-10 DIAGNOSIS — E46 Unspecified protein-calorie malnutrition: Secondary | ICD-10-CM | POA: Diagnosis not present

## 2014-08-10 DIAGNOSIS — M859 Disorder of bone density and structure, unspecified: Secondary | ICD-10-CM | POA: Diagnosis not present

## 2014-08-10 DIAGNOSIS — R809 Proteinuria, unspecified: Secondary | ICD-10-CM | POA: Diagnosis not present

## 2014-08-10 DIAGNOSIS — F17210 Nicotine dependence, cigarettes, uncomplicated: Secondary | ICD-10-CM | POA: Diagnosis not present

## 2014-08-10 DIAGNOSIS — H409 Unspecified glaucoma: Secondary | ICD-10-CM | POA: Diagnosis not present

## 2014-08-10 DIAGNOSIS — Z1389 Encounter for screening for other disorder: Secondary | ICD-10-CM | POA: Diagnosis not present

## 2014-08-10 DIAGNOSIS — Z681 Body mass index (BMI) 19 or less, adult: Secondary | ICD-10-CM | POA: Diagnosis not present

## 2014-08-10 DIAGNOSIS — I10 Essential (primary) hypertension: Secondary | ICD-10-CM | POA: Diagnosis not present

## 2014-08-10 DIAGNOSIS — Z0000 Encounter for general adult medical examination without abnormal findings: Secondary | ICD-10-CM | POA: Diagnosis not present

## 2014-08-10 DIAGNOSIS — R739 Hyperglycemia, unspecified: Secondary | ICD-10-CM | POA: Diagnosis not present

## 2014-08-10 DIAGNOSIS — R312 Other microscopic hematuria: Secondary | ICD-10-CM | POA: Diagnosis not present

## 2014-08-10 DIAGNOSIS — K635 Polyp of colon: Secondary | ICD-10-CM | POA: Diagnosis not present

## 2014-08-31 DIAGNOSIS — H2513 Age-related nuclear cataract, bilateral: Secondary | ICD-10-CM | POA: Diagnosis not present

## 2014-08-31 DIAGNOSIS — H4011X3 Primary open-angle glaucoma, severe stage: Secondary | ICD-10-CM | POA: Diagnosis not present

## 2014-09-01 ENCOUNTER — Other Ambulatory Visit: Payer: Self-pay | Admitting: Obstetrics and Gynecology

## 2014-09-01 DIAGNOSIS — N390 Urinary tract infection, site not specified: Secondary | ICD-10-CM | POA: Diagnosis not present

## 2014-09-01 DIAGNOSIS — Z124 Encounter for screening for malignant neoplasm of cervix: Secondary | ICD-10-CM | POA: Diagnosis not present

## 2014-09-01 DIAGNOSIS — Z1231 Encounter for screening mammogram for malignant neoplasm of breast: Secondary | ICD-10-CM | POA: Diagnosis not present

## 2014-09-03 LAB — CYTOLOGY - PAP

## 2014-11-04 DIAGNOSIS — H4011X3 Primary open-angle glaucoma, severe stage: Secondary | ICD-10-CM | POA: Diagnosis not present

## 2014-11-19 ENCOUNTER — Other Ambulatory Visit (HOSPITAL_COMMUNITY): Payer: Self-pay | Admitting: Internal Medicine

## 2014-11-19 ENCOUNTER — Encounter (HOSPITAL_COMMUNITY): Payer: Self-pay

## 2014-11-19 ENCOUNTER — Ambulatory Visit (HOSPITAL_COMMUNITY)
Admission: RE | Admit: 2014-11-19 | Discharge: 2014-11-19 | Disposition: A | Discharge status: Home / Self Care | Payer: Medicare Other | Source: Ambulatory Visit | Attending: Internal Medicine | Admitting: Internal Medicine

## 2014-11-19 DIAGNOSIS — M810 Age-related osteoporosis without current pathological fracture: Secondary | ICD-10-CM | POA: Diagnosis not present

## 2014-11-19 HISTORY — DX: Unspecified glaucoma: H40.9

## 2014-11-19 MED ORDER — DENOSUMAB 60 MG/ML ~~LOC~~ SOLN
60.0000 mg | Freq: Once | SUBCUTANEOUS | Status: AC
  Administered 2014-11-19: 60 mg via SUBCUTANEOUS
  Filled 2014-11-19: qty 1

## 2014-11-19 NOTE — Discharge Instructions (Signed)
Denosumab injection What is this medicine? DENOSUMAB (den oh sue mab) slows bone breakdown. Prolia is used to treat osteoporosis in women after menopause and in men. Xgeva is used to prevent bone fractures and other bone problems caused by cancer bone metastases. Xgeva is also used to treat giant cell tumor of the bone. This medicine may be used for other purposes; ask your health care provider or pharmacist if you have questions. COMMON BRAND NAME(S): Prolia, XGEVA What should I tell my health care provider before I take this medicine? They need to know if you have any of these conditions: -dental disease -eczema -infection or history of infections -kidney disease or on dialysis -low blood calcium or vitamin D -malabsorption syndrome -scheduled to have surgery or tooth extraction -taking medicine that contains denosumab -thyroid or parathyroid disease -an unusual reaction to denosumab, other medicines, foods, dyes, or preservatives -pregnant or trying to get pregnant -breast-feeding How should I use this medicine? This medicine is for injection under the skin. It is given by a health care professional in a hospital or clinic setting. If you are getting Prolia, a special MedGuide will be given to you by the pharmacist with each prescription and refill. Be sure to read this information carefully each time. For Prolia, talk to your pediatrician regarding the use of this medicine in children. Special care may be needed. For Xgeva, talk to your pediatrician regarding the use of this medicine in children. While this drug may be prescribed for children as young as 13 years for selected conditions, precautions do apply. Overdosage: If you think you've taken too much of this medicine contact a poison control center or emergency room at once. Overdosage: If you think you have taken too much of this medicine contact a poison control center or emergency room at once. NOTE: This medicine is only for  you. Do not share this medicine with others. What if I miss a dose? It is important not to miss your dose. Call your doctor or health care professional if you are unable to keep an appointment. What may interact with this medicine? Do not take this medicine with any of the following medications: -other medicines containing denosumab This medicine may also interact with the following medications: -medicines that suppress the immune system -medicines that treat cancer -steroid medicines like prednisone or cortisone This list may not describe all possible interactions. Give your health care provider a list of all the medicines, herbs, non-prescription drugs, or dietary supplements you use. Also tell them if you smoke, drink alcohol, or use illegal drugs. Some items may interact with your medicine. What should I watch for while using this medicine? Visit your doctor or health care professional for regular checks on your progress. Your doctor or health care professional may order blood tests and other tests to see how you are doing. Call your doctor or health care professional if you get a cold or other infection while receiving this medicine. Do not treat yourself. This medicine may decrease your body's ability to fight infection. You should make sure you get enough calcium and vitamin D while you are taking this medicine, unless your doctor tells you not to. Discuss the foods you eat and the vitamins you take with your health care professional. See your dentist regularly. Brush and floss your teeth as directed. Before you have any dental work done, tell your dentist you are receiving this medicine. Do not become pregnant while taking this medicine or for 5 months after stopping   it. Women should inform their doctor if they wish to become pregnant or think they might be pregnant. There is a potential for serious side effects to an unborn child. Talk to your health care professional or pharmacist for more  information. What side effects may I notice from receiving this medicine? Side effects that you should report to your doctor or health care professional as soon as possible: -allergic reactions like skin rash, itching or hives, swelling of the face, lips, or tongue -breathing problems -chest pain -fast, irregular heartbeat -feeling faint or lightheaded, falls -fever, chills, or any other sign of infection -muscle spasms, tightening, or twitches -numbness or tingling -skin blisters or bumps, or is dry, peels, or red -slow healing or unexplained pain in the mouth or jaw -unusual bleeding or bruising Side effects that usually do not require medical attention (Report these to your doctor or health care professional if they continue or are bothersome.): -muscle pain -stomach upset, gas This list may not describe all possible side effects. Call your doctor for medical advice about side effects. You may report side effects to FDA at 1-800-FDA-1088. Where should I keep my medicine? This medicine is only given in a clinic, doctor's office, or other health care setting and will not be stored at home. NOTE: This sheet is a summary. It may not cover all possible information. If you have questions about this medicine, talk to your doctor, pharmacist, or health care provider.  2015, [employer]. (2011-09-11 12:37:47)  

## 2014-12-29 DIAGNOSIS — Z23 Encounter for immunization: Secondary | ICD-10-CM | POA: Diagnosis not present

## 2015-01-25 DIAGNOSIS — H2513 Age-related nuclear cataract, bilateral: Secondary | ICD-10-CM | POA: Diagnosis not present

## 2015-01-25 DIAGNOSIS — H401133 Primary open-angle glaucoma, bilateral, severe stage: Secondary | ICD-10-CM | POA: Diagnosis not present

## 2015-02-08 DIAGNOSIS — K635 Polyp of colon: Secondary | ICD-10-CM | POA: Diagnosis not present

## 2015-02-08 DIAGNOSIS — Z681 Body mass index (BMI) 19 or less, adult: Secondary | ICD-10-CM | POA: Diagnosis not present

## 2015-02-08 DIAGNOSIS — F17210 Nicotine dependence, cigarettes, uncomplicated: Secondary | ICD-10-CM | POA: Diagnosis not present

## 2015-02-08 DIAGNOSIS — E46 Unspecified protein-calorie malnutrition: Secondary | ICD-10-CM | POA: Diagnosis not present

## 2015-02-08 DIAGNOSIS — J449 Chronic obstructive pulmonary disease, unspecified: Secondary | ICD-10-CM | POA: Diagnosis not present

## 2015-02-08 DIAGNOSIS — I10 Essential (primary) hypertension: Secondary | ICD-10-CM | POA: Diagnosis not present

## 2015-02-08 DIAGNOSIS — E785 Hyperlipidemia, unspecified: Secondary | ICD-10-CM | POA: Diagnosis not present

## 2015-05-20 ENCOUNTER — Ambulatory Visit (HOSPITAL_COMMUNITY): Payer: Medicare Other

## 2015-05-28 ENCOUNTER — Other Ambulatory Visit (HOSPITAL_COMMUNITY): Payer: Self-pay | Admitting: Internal Medicine

## 2015-05-28 ENCOUNTER — Ambulatory Visit (HOSPITAL_COMMUNITY)
Admission: RE | Admit: 2015-05-28 | Discharge: 2015-05-28 | Disposition: A | Discharge status: Home / Self Care | Payer: Medicare Other | Source: Ambulatory Visit | Attending: Internal Medicine | Admitting: Internal Medicine

## 2015-05-28 DIAGNOSIS — M810 Age-related osteoporosis without current pathological fracture: Secondary | ICD-10-CM | POA: Diagnosis not present

## 2015-05-28 MED ORDER — DENOSUMAB 60 MG/ML ~~LOC~~ SOLN
60.0000 mg | Freq: Once | SUBCUTANEOUS | Status: AC
  Administered 2015-05-28: 60 mg via SUBCUTANEOUS
  Filled 2015-05-28: qty 1

## 2015-05-28 NOTE — Discharge Instructions (Signed)
Denosumab injection  What is this medicine?  DENOSUMAB (den oh sue mab) slows bone breakdown. Prolia is used to treat osteoporosis in women after menopause and in men. Xgeva is used to prevent bone fractures and other bone problems caused by cancer bone metastases. Xgeva is also used to treat giant cell tumor of the bone.  This medicine may be used for other purposes; ask your health care provider or pharmacist if you have questions.  What should I tell my health care provider before I take this medicine?  They need to know if you have any of these conditions:  -dental disease  -eczema  -infection or history of infections  -kidney disease or on dialysis  -low blood calcium or vitamin D  -malabsorption syndrome  -scheduled to have surgery or tooth extraction  -taking medicine that contains denosumab  -thyroid or parathyroid disease  -an unusual reaction to denosumab, other medicines, foods, dyes, or preservatives  -pregnant or trying to get pregnant  -breast-feeding  How should I use this medicine?  This medicine is for injection under the skin. It is given by a health care professional in a hospital or clinic setting.  If you are getting Prolia, a special MedGuide will be given to you by the pharmacist with each prescription and refill. Be sure to read this information carefully each time.  For Prolia, talk to your pediatrician regarding the use of this medicine in children. Special care may be needed. For Xgeva, talk to your pediatrician regarding the use of this medicine in children. While this drug may be prescribed for children as young as 13 years for selected conditions, precautions do apply.  Overdosage: If you think you have taken too much of this medicine contact a poison control center or emergency room at once.  NOTE: This medicine is only for you. Do not share this medicine with others.  What if I miss a dose?  It is important not to miss your dose. Call your doctor or health care professional if you are  unable to keep an appointment.  What may interact with this medicine?  Do not take this medicine with any of the following medications:  -other medicines containing denosumab  This medicine may also interact with the following medications:  -medicines that suppress the immune system  -medicines that treat cancer  -steroid medicines like prednisone or cortisone  This list may not describe all possible interactions. Give your health care provider a list of all the medicines, herbs, non-prescription drugs, or dietary supplements you use. Also tell them if you smoke, drink alcohol, or use illegal drugs. Some items may interact with your medicine.  What should I watch for while using this medicine?  Visit your doctor or health care professional for regular checks on your progress. Your doctor or health care professional may order blood tests and other tests to see how you are doing.  Call your doctor or health care professional if you get a cold or other infection while receiving this medicine. Do not treat yourself. This medicine may decrease your body's ability to fight infection.  You should make sure you get enough calcium and vitamin D while you are taking this medicine, unless your doctor tells you not to. Discuss the foods you eat and the vitamins you take with your health care professional.  See your dentist regularly. Brush and floss your teeth as directed. Before you have any dental work done, tell your dentist you are receiving this medicine.  Do   not become pregnant while taking this medicine or for 5 months after stopping it. Women should inform their doctor if they wish to become pregnant or think they might be pregnant. There is a potential for serious side effects to an unborn child. Talk to your health care professional or pharmacist for more information.  What side effects may I notice from receiving this medicine?  Side effects that you should report to your doctor or health care professional as soon as  possible:  -allergic reactions like skin rash, itching or hives, swelling of the face, lips, or tongue  -breathing problems  -chest pain  -fast, irregular heartbeat  -feeling faint or lightheaded, falls  -fever, chills, or any other sign of infection  -muscle spasms, tightening, or twitches  -numbness or tingling  -skin blisters or bumps, or is dry, peels, or red  -slow healing or unexplained pain in the mouth or jaw  -unusual bleeding or bruising  Side effects that usually do not require medical attention (Report these to your doctor or health care professional if they continue or are bothersome.):  -muscle pain  -stomach upset, gas  This list may not describe all possible side effects. Call your doctor for medical advice about side effects. You may report side effects to FDA at 1-800-FDA-1088.  Where should I keep my medicine?  This medicine is only given in a clinic, doctor's office, or other health care setting and will not be stored at home.  NOTE: This sheet is a summary. It may not cover all possible information. If you have questions about this medicine, talk to your doctor, pharmacist, or health care provider.      2016, [employer]. (2011-09-11 12:37:47)

## 2015-06-17 ENCOUNTER — Ambulatory Visit (HOSPITAL_COMMUNITY): Payer: Medicare Other

## 2015-06-23 DIAGNOSIS — R0602 Shortness of breath: Secondary | ICD-10-CM | POA: Diagnosis not present

## 2015-06-23 DIAGNOSIS — F17210 Nicotine dependence, cigarettes, uncomplicated: Secondary | ICD-10-CM | POA: Diagnosis not present

## 2015-06-23 DIAGNOSIS — R0789 Other chest pain: Secondary | ICD-10-CM | POA: Diagnosis not present

## 2015-06-23 DIAGNOSIS — J449 Chronic obstructive pulmonary disease, unspecified: Secondary | ICD-10-CM | POA: Diagnosis not present

## 2015-06-23 DIAGNOSIS — J301 Allergic rhinitis due to pollen: Secondary | ICD-10-CM | POA: Diagnosis not present

## 2015-06-23 DIAGNOSIS — Z681 Body mass index (BMI) 19 or less, adult: Secondary | ICD-10-CM | POA: Diagnosis not present

## 2015-07-05 DIAGNOSIS — H401133 Primary open-angle glaucoma, bilateral, severe stage: Secondary | ICD-10-CM | POA: Diagnosis not present

## 2015-08-09 DIAGNOSIS — R8299 Other abnormal findings in urine: Secondary | ICD-10-CM | POA: Diagnosis not present

## 2015-08-09 DIAGNOSIS — I10 Essential (primary) hypertension: Secondary | ICD-10-CM | POA: Diagnosis not present

## 2015-08-09 DIAGNOSIS — R7309 Other abnormal glucose: Secondary | ICD-10-CM | POA: Diagnosis not present

## 2015-08-09 DIAGNOSIS — M859 Disorder of bone density and structure, unspecified: Secondary | ICD-10-CM | POA: Diagnosis not present

## 2015-08-09 DIAGNOSIS — E784 Other hyperlipidemia: Secondary | ICD-10-CM | POA: Diagnosis not present

## 2015-08-16 DIAGNOSIS — K635 Polyp of colon: Secondary | ICD-10-CM | POA: Diagnosis not present

## 2015-08-16 DIAGNOSIS — R739 Hyperglycemia, unspecified: Secondary | ICD-10-CM | POA: Diagnosis not present

## 2015-08-16 DIAGNOSIS — E46 Unspecified protein-calorie malnutrition: Secondary | ICD-10-CM | POA: Diagnosis not present

## 2015-08-16 DIAGNOSIS — Z0000 Encounter for general adult medical examination without abnormal findings: Secondary | ICD-10-CM | POA: Diagnosis not present

## 2015-08-16 DIAGNOSIS — Z1389 Encounter for screening for other disorder: Secondary | ICD-10-CM | POA: Diagnosis not present

## 2015-08-16 DIAGNOSIS — R0602 Shortness of breath: Secondary | ICD-10-CM | POA: Diagnosis not present

## 2015-08-16 DIAGNOSIS — Z6820 Body mass index (BMI) 20.0-20.9, adult: Secondary | ICD-10-CM | POA: Diagnosis not present

## 2015-08-16 DIAGNOSIS — D6489 Other specified anemias: Secondary | ICD-10-CM | POA: Diagnosis not present

## 2015-08-16 DIAGNOSIS — R809 Proteinuria, unspecified: Secondary | ICD-10-CM | POA: Diagnosis not present

## 2015-08-16 DIAGNOSIS — L409 Psoriasis, unspecified: Secondary | ICD-10-CM | POA: Diagnosis not present

## 2015-08-16 DIAGNOSIS — J449 Chronic obstructive pulmonary disease, unspecified: Secondary | ICD-10-CM | POA: Diagnosis not present

## 2015-09-02 DIAGNOSIS — Z1231 Encounter for screening mammogram for malignant neoplasm of breast: Secondary | ICD-10-CM | POA: Diagnosis not present

## 2015-10-11 DIAGNOSIS — M859 Disorder of bone density and structure, unspecified: Secondary | ICD-10-CM | POA: Diagnosis not present

## 2015-10-11 DIAGNOSIS — N183 Chronic kidney disease, stage 3 (moderate): Secondary | ICD-10-CM | POA: Diagnosis not present

## 2015-12-22 DIAGNOSIS — H401133 Primary open-angle glaucoma, bilateral, severe stage: Secondary | ICD-10-CM | POA: Diagnosis not present

## 2016-01-04 DIAGNOSIS — Z23 Encounter for immunization: Secondary | ICD-10-CM | POA: Diagnosis not present

## 2016-02-15 DIAGNOSIS — I129 Hypertensive chronic kidney disease with stage 1 through stage 4 chronic kidney disease, or unspecified chronic kidney disease: Secondary | ICD-10-CM | POA: Diagnosis not present

## 2016-02-15 DIAGNOSIS — J449 Chronic obstructive pulmonary disease, unspecified: Secondary | ICD-10-CM | POA: Diagnosis not present

## 2016-02-15 DIAGNOSIS — N183 Chronic kidney disease, stage 3 (moderate): Secondary | ICD-10-CM | POA: Diagnosis not present

## 2016-02-15 DIAGNOSIS — R7309 Other abnormal glucose: Secondary | ICD-10-CM | POA: Diagnosis not present

## 2016-02-15 DIAGNOSIS — M810 Age-related osteoporosis without current pathological fracture: Secondary | ICD-10-CM | POA: Diagnosis not present

## 2016-02-15 DIAGNOSIS — I10 Essential (primary) hypertension: Secondary | ICD-10-CM | POA: Diagnosis not present

## 2016-02-15 DIAGNOSIS — E784 Other hyperlipidemia: Secondary | ICD-10-CM | POA: Diagnosis not present

## 2016-02-15 DIAGNOSIS — E46 Unspecified protein-calorie malnutrition: Secondary | ICD-10-CM | POA: Diagnosis not present

## 2016-02-15 DIAGNOSIS — F17210 Nicotine dependence, cigarettes, uncomplicated: Secondary | ICD-10-CM | POA: Diagnosis not present

## 2016-02-15 DIAGNOSIS — Z6820 Body mass index (BMI) 20.0-20.9, adult: Secondary | ICD-10-CM | POA: Diagnosis not present

## 2016-05-26 DIAGNOSIS — H401133 Primary open-angle glaucoma, bilateral, severe stage: Secondary | ICD-10-CM | POA: Diagnosis not present

## 2016-06-12 DIAGNOSIS — H401133 Primary open-angle glaucoma, bilateral, severe stage: Secondary | ICD-10-CM | POA: Diagnosis not present

## 2016-06-12 DIAGNOSIS — H2513 Age-related nuclear cataract, bilateral: Secondary | ICD-10-CM | POA: Diagnosis not present

## 2016-07-24 ENCOUNTER — Ambulatory Visit (HOSPITAL_COMMUNITY)
Admission: RE | Admit: 2016-07-24 | Discharge: 2016-07-24 | Disposition: A | Discharge status: Home / Self Care | Payer: Medicare Other | Source: Ambulatory Visit | Attending: Internal Medicine | Admitting: Internal Medicine

## 2016-08-15 DIAGNOSIS — M810 Age-related osteoporosis without current pathological fracture: Secondary | ICD-10-CM | POA: Diagnosis not present

## 2016-08-15 DIAGNOSIS — R8299 Other abnormal findings in urine: Secondary | ICD-10-CM | POA: Diagnosis not present

## 2016-08-15 DIAGNOSIS — R7309 Other abnormal glucose: Secondary | ICD-10-CM | POA: Diagnosis not present

## 2016-08-15 DIAGNOSIS — E784 Other hyperlipidemia: Secondary | ICD-10-CM | POA: Diagnosis not present

## 2016-08-15 DIAGNOSIS — R358 Other polyuria: Secondary | ICD-10-CM | POA: Diagnosis not present

## 2016-08-15 DIAGNOSIS — I10 Essential (primary) hypertension: Secondary | ICD-10-CM | POA: Diagnosis not present

## 2016-08-22 DIAGNOSIS — I77819 Aortic ectasia, unspecified site: Secondary | ICD-10-CM | POA: Diagnosis not present

## 2016-08-22 DIAGNOSIS — Z1389 Encounter for screening for other disorder: Secondary | ICD-10-CM | POA: Diagnosis not present

## 2016-08-22 DIAGNOSIS — D692 Other nonthrombocytopenic purpura: Secondary | ICD-10-CM | POA: Diagnosis not present

## 2016-08-22 DIAGNOSIS — J449 Chronic obstructive pulmonary disease, unspecified: Secondary | ICD-10-CM | POA: Diagnosis not present

## 2016-08-22 DIAGNOSIS — Z0000 Encounter for general adult medical examination without abnormal findings: Secondary | ICD-10-CM | POA: Diagnosis not present

## 2016-08-22 DIAGNOSIS — E46 Unspecified protein-calorie malnutrition: Secondary | ICD-10-CM | POA: Diagnosis not present

## 2016-08-22 DIAGNOSIS — I129 Hypertensive chronic kidney disease with stage 1 through stage 4 chronic kidney disease, or unspecified chronic kidney disease: Secondary | ICD-10-CM | POA: Diagnosis not present

## 2016-08-22 DIAGNOSIS — H4089 Other specified glaucoma: Secondary | ICD-10-CM | POA: Diagnosis not present

## 2016-08-22 DIAGNOSIS — L408 Other psoriasis: Secondary | ICD-10-CM | POA: Diagnosis not present

## 2016-08-22 DIAGNOSIS — Z681 Body mass index (BMI) 19 or less, adult: Secondary | ICD-10-CM | POA: Diagnosis not present

## 2016-08-22 DIAGNOSIS — N183 Chronic kidney disease, stage 3 (moderate): Secondary | ICD-10-CM | POA: Diagnosis not present

## 2016-08-22 DIAGNOSIS — C4491 Basal cell carcinoma of skin, unspecified: Secondary | ICD-10-CM | POA: Diagnosis not present

## 2016-08-25 DIAGNOSIS — D485 Neoplasm of uncertain behavior of skin: Secondary | ICD-10-CM | POA: Diagnosis not present

## 2016-08-25 DIAGNOSIS — C4441 Basal cell carcinoma of skin of scalp and neck: Secondary | ICD-10-CM | POA: Diagnosis not present

## 2016-08-25 DIAGNOSIS — L400 Psoriasis vulgaris: Secondary | ICD-10-CM | POA: Diagnosis not present

## 2016-10-19 DIAGNOSIS — Z1289 Encounter for screening for malignant neoplasm of other sites: Secondary | ICD-10-CM | POA: Diagnosis not present

## 2016-10-19 DIAGNOSIS — Z1231 Encounter for screening mammogram for malignant neoplasm of breast: Secondary | ICD-10-CM | POA: Diagnosis not present

## 2016-12-18 DIAGNOSIS — H401133 Primary open-angle glaucoma, bilateral, severe stage: Secondary | ICD-10-CM | POA: Diagnosis not present

## 2016-12-18 DIAGNOSIS — H2513 Age-related nuclear cataract, bilateral: Secondary | ICD-10-CM | POA: Diagnosis not present

## 2016-12-22 DIAGNOSIS — L821 Other seborrheic keratosis: Secondary | ICD-10-CM | POA: Diagnosis not present

## 2016-12-22 DIAGNOSIS — L400 Psoriasis vulgaris: Secondary | ICD-10-CM | POA: Diagnosis not present

## 2016-12-22 DIAGNOSIS — Z85828 Personal history of other malignant neoplasm of skin: Secondary | ICD-10-CM | POA: Diagnosis not present

## 2016-12-22 DIAGNOSIS — L570 Actinic keratosis: Secondary | ICD-10-CM | POA: Diagnosis not present

## 2017-01-26 DIAGNOSIS — H401133 Primary open-angle glaucoma, bilateral, severe stage: Secondary | ICD-10-CM | POA: Diagnosis not present

## 2017-01-29 DIAGNOSIS — Z23 Encounter for immunization: Secondary | ICD-10-CM | POA: Diagnosis not present

## 2017-02-09 DIAGNOSIS — H401113 Primary open-angle glaucoma, right eye, severe stage: Secondary | ICD-10-CM | POA: Diagnosis not present

## 2017-02-09 DIAGNOSIS — H401133 Primary open-angle glaucoma, bilateral, severe stage: Secondary | ICD-10-CM | POA: Diagnosis not present

## 2017-02-19 DIAGNOSIS — N183 Chronic kidney disease, stage 3 (moderate): Secondary | ICD-10-CM | POA: Diagnosis not present

## 2017-02-19 DIAGNOSIS — J449 Chronic obstructive pulmonary disease, unspecified: Secondary | ICD-10-CM | POA: Diagnosis not present

## 2017-02-19 DIAGNOSIS — D692 Other nonthrombocytopenic purpura: Secondary | ICD-10-CM | POA: Diagnosis not present

## 2017-02-19 DIAGNOSIS — E46 Unspecified protein-calorie malnutrition: Secondary | ICD-10-CM | POA: Diagnosis not present

## 2017-02-19 DIAGNOSIS — L408 Other psoriasis: Secondary | ICD-10-CM | POA: Diagnosis not present

## 2017-02-19 DIAGNOSIS — C4491 Basal cell carcinoma of skin, unspecified: Secondary | ICD-10-CM | POA: Diagnosis not present

## 2017-02-19 DIAGNOSIS — I129 Hypertensive chronic kidney disease with stage 1 through stage 4 chronic kidney disease, or unspecified chronic kidney disease: Secondary | ICD-10-CM | POA: Diagnosis not present

## 2017-02-19 DIAGNOSIS — Z681 Body mass index (BMI) 19 or less, adult: Secondary | ICD-10-CM | POA: Diagnosis not present

## 2017-02-19 DIAGNOSIS — I77819 Aortic ectasia, unspecified site: Secondary | ICD-10-CM | POA: Diagnosis not present

## 2017-02-26 DIAGNOSIS — H25813 Combined forms of age-related cataract, bilateral: Secondary | ICD-10-CM | POA: Diagnosis not present

## 2017-02-26 DIAGNOSIS — H401133 Primary open-angle glaucoma, bilateral, severe stage: Secondary | ICD-10-CM | POA: Diagnosis not present

## 2017-04-09 DIAGNOSIS — H25813 Combined forms of age-related cataract, bilateral: Secondary | ICD-10-CM | POA: Diagnosis not present

## 2017-04-09 DIAGNOSIS — H401133 Primary open-angle glaucoma, bilateral, severe stage: Secondary | ICD-10-CM | POA: Diagnosis not present

## 2017-07-09 DIAGNOSIS — H401133 Primary open-angle glaucoma, bilateral, severe stage: Secondary | ICD-10-CM | POA: Diagnosis not present

## 2017-08-17 DIAGNOSIS — E7849 Other hyperlipidemia: Secondary | ICD-10-CM | POA: Diagnosis not present

## 2017-08-17 DIAGNOSIS — R7989 Other specified abnormal findings of blood chemistry: Secondary | ICD-10-CM | POA: Diagnosis not present

## 2017-08-17 DIAGNOSIS — M810 Age-related osteoporosis without current pathological fracture: Secondary | ICD-10-CM | POA: Diagnosis not present

## 2017-08-17 DIAGNOSIS — I10 Essential (primary) hypertension: Secondary | ICD-10-CM | POA: Diagnosis not present

## 2017-08-17 DIAGNOSIS — R7309 Other abnormal glucose: Secondary | ICD-10-CM | POA: Diagnosis not present

## 2017-08-29 DIAGNOSIS — R627 Adult failure to thrive: Secondary | ICD-10-CM | POA: Diagnosis not present

## 2017-08-29 DIAGNOSIS — D692 Other nonthrombocytopenic purpura: Secondary | ICD-10-CM | POA: Diagnosis not present

## 2017-08-29 DIAGNOSIS — I77819 Aortic ectasia, unspecified site: Secondary | ICD-10-CM | POA: Diagnosis not present

## 2017-08-29 DIAGNOSIS — Z681 Body mass index (BMI) 19 or less, adult: Secondary | ICD-10-CM | POA: Diagnosis not present

## 2017-08-29 DIAGNOSIS — E46 Unspecified protein-calorie malnutrition: Secondary | ICD-10-CM | POA: Diagnosis not present

## 2017-08-29 DIAGNOSIS — Z1389 Encounter for screening for other disorder: Secondary | ICD-10-CM | POA: Diagnosis not present

## 2017-08-29 DIAGNOSIS — H4089 Other specified glaucoma: Secondary | ICD-10-CM | POA: Diagnosis not present

## 2017-08-29 DIAGNOSIS — N183 Chronic kidney disease, stage 3 (moderate): Secondary | ICD-10-CM | POA: Diagnosis not present

## 2017-08-29 DIAGNOSIS — C4491 Basal cell carcinoma of skin, unspecified: Secondary | ICD-10-CM | POA: Diagnosis not present

## 2017-08-29 DIAGNOSIS — I129 Hypertensive chronic kidney disease with stage 1 through stage 4 chronic kidney disease, or unspecified chronic kidney disease: Secondary | ICD-10-CM | POA: Diagnosis not present

## 2017-08-29 DIAGNOSIS — L409 Psoriasis, unspecified: Secondary | ICD-10-CM | POA: Diagnosis not present

## 2017-08-29 DIAGNOSIS — Z0000 Encounter for general adult medical examination without abnormal findings: Secondary | ICD-10-CM | POA: Diagnosis not present

## 2017-09-20 ENCOUNTER — Ambulatory Visit (HOSPITAL_COMMUNITY)
Admission: EM | Admit: 2017-09-20 | Discharge: 2017-09-20 | Disposition: A | Discharge status: Home / Self Care | Payer: Medicare Other | Attending: Family Medicine | Admitting: Family Medicine

## 2017-09-20 ENCOUNTER — Ambulatory Visit (INDEPENDENT_AMBULATORY_CARE_PROVIDER_SITE_OTHER): Payer: Medicare Other

## 2017-09-20 ENCOUNTER — Encounter (HOSPITAL_COMMUNITY): Payer: Self-pay | Admitting: Emergency Medicine

## 2017-09-20 DIAGNOSIS — M25552 Pain in left hip: Secondary | ICD-10-CM

## 2017-09-20 DIAGNOSIS — W19XXXA Unspecified fall, initial encounter: Secondary | ICD-10-CM

## 2017-09-20 DIAGNOSIS — W01198A Fall on same level from slipping, tripping and stumbling with subsequent striking against other object, initial encounter: Secondary | ICD-10-CM

## 2017-09-20 DIAGNOSIS — S79912A Unspecified injury of left hip, initial encounter: Secondary | ICD-10-CM | POA: Diagnosis not present

## 2017-09-20 NOTE — ED Provider Notes (Signed)
[facility]   [id number] 09/20/17 Arrival Time: 30   SUBJECTIVE:  Jenny Sexton is a 84 y.o. female who presents to the urgent care with complaint of left hip pain.  Pt tripped over a chair and landed on her hip. Denies hitting head. C/o L hip pain  No arm pain, shortness of breath.  Patient was able to bear some weight using her walker afterwards.  Pain is definitely worse with movement of hip or weight bearing.   Past Medical History:  Diagnosis Date  . Cardiomegaly    Stable  . Chest pain    Occasional non cardiac  . COPD (chronic obstructive pulmonary disease) ([place])    With ongoing smoking  . Glaucoma   . Hypercholesterolemia   . Hypertension    ESSENTIAL  . Osteoporosis   . Pelvic relaxation    with pessary  . Psoriasis   . Tobacco abuse    Family History  Problem Relation Age of Onset  . Heart attack Father   . Cancer Mother    Social History   Socioeconomic History  . Marital status: Widowed    Spouse name: Not on file  . Number of children: Not on file  . Years of education: Not on file  . Highest education level: Not on file  Occupational History  . Not on file  Social Needs  . Financial resource strain: Not on file  . Food insecurity:    Worry: Not on file    Inability: Not on file  . Transportation needs:    Medical: Not on file    Non-medical: Not on file  Tobacco Use  . Smoking status: Smoker, Current Status Unknown    Packs/day: 1.00  . Smokeless tobacco: Never Used  Substance and Sexual Activity  . Alcohol use: Not on file  . Drug use: Not on file  . Sexual activity: Not on file  Lifestyle  . Physical activity:    Days per week: Not on file    Minutes per session: Not on file  . Stress: Not on file  Relationships  . Social connections:    Talks on phone: Not on file    Gets together: Not on file    Attends religious service: Not on file    Active member of club or organization: Not on file    Attends meetings of  clubs or organizations: Not on file    Relationship status: Not on file  . Intimate partner violence:    Fear of current or ex partner: Not on file    Emotionally abused: Not on file    Physically abused: Not on file    Forced sexual activity: Not on file  Other Topics Concern  . Not on file  Social History Narrative  . Not on file   No outpatient medications have been marked as taking for the 09/20/17 encounter [facility] Encounter).   Allergies  Allergen Reactions  . Amoxicillin   . Codeine     nausea      ROS: As per HPI, remainder of ROS negative.   OBJECTIVE:   Vitals:   09/20/17 1836  BP: 122/67  Pulse: 66  Resp: 18  Temp: 97.9 F (36.6 C)  SpO2: 96%     General appearance: alert; no distress Eyes: PERRL; EOMI; conjunctiva normal HENT: normocephalic; atraumatic;oral mucosa normal Neck: supple Back: no CVA tenderness Extremities: no cyanosis or edema; symmetrical with no gross deformities;  Able to move left leg with  discomfort in all directions;  Tender trochanter and posterior iliac spine; no obvious STS or ecchymosis. Skin: warm and dry Neurologic: normal gait; grossly normal Psychological: alert and cooperative; normal mood and affect      Labs:  Results for orders placed or performed in visit on 09/01/14  Cytology - PAP  Result Value Ref Range   CYTOLOGY - PAP PAP RESULT     Labs Reviewed - No data to display  Dg Hip Unilat With Pelvis 2-3 Views Left  Result Date: 09/20/2017 CLINICAL DATA:  Fall today with left hip pain EXAM: DG HIP (WITH OR WITHOUT PELVIS) 2-3V LEFT COMPARISON:  05/03/2009 CT abdomen/pelvis FINDINGS: No pelvic fracture or diastasis. No left hip fracture or dislocation. No significant hip arthropathy. No suspicious focal osseous lesion. Prominent degenerative changes and dextrocurvature in the visualized lower lumbar spine. IMPRESSION: No fracture.  No left hip malalignment. Electronically Signed   By: Ilona Sorrel M.D.   On:  09/20/2017 19:24       ASSESSMENT & PLAN:  1. Fall, initial encounter   2. Fall   3. Hip pain, left   EXAM: DG HIP (WITH OR WITHOUT PELVIS) 2-3V LEFT   COMPARISON:  05/03/2009 CT abdomen/pelvis   FINDINGS: No pelvic fracture or diastasis. No left hip fracture or dislocation. No significant hip arthropathy. No suspicious focal osseous lesion. Prominent degenerative changes and dextrocurvature in the visualized lower lumbar spine.   IMPRESSION: No fracture.  No left hip malalignment.     Electronically Signed   By: Ilona Sorrel M.D.   On: 09/20/2017 19:24  We don't see a fracture.  Instead, this appears to be a contusion, or deep muscle bruise.   You will have stiffness and soreness for the next several days.  It should start to improve by Sunday.  You may benefit from ibuprofen as needed. Apply ice tonight. If pain fails to improve by Sunday, or it worsens quite a bit before then, come back in for reevaluation here or the emergency department   No orders of the defined types were placed in this encounter.   Reviewed expectations re: course of current medical issues. Questions answered. Outlined signs and symptoms indicating need for more acute intervention. Patient verbalized understanding. After Visit Summary given.    Procedures:      Robyn Haber, MD 09/20/17 1931

## 2017-09-20 NOTE — Discharge Instructions (Addendum)
EXAM: DG HIP (WITH OR WITHOUT PELVIS) 2-3V LEFT   COMPARISON:  05/03/2009 CT abdomen/pelvis   FINDINGS: No pelvic fracture or diastasis. No left hip fracture or dislocation. No significant hip arthropathy. No suspicious focal osseous lesion. Prominent degenerative changes and dextrocurvature in the visualized lower lumbar spine.   IMPRESSION: No fracture.  No left hip malalignment.     Electronically Signed   By: Ilona Sorrel M.D.   On: 09/20/2017 19:24  We don't see a fracture.  Instead, this appears to be a contusion, or deep muscle bruise.   You will have stiffness and soreness for the next several days.  It should start to improve by Sunday.  You may benefit from ibuprofen as needed. Apply ice tonight. If pain fails to improve by Sunday, or it worsens quite a bit before then, come back in for reevaluation here or the emergency department

## 2017-09-20 NOTE — ED Triage Notes (Signed)
Pt tripped over a chair and landed on her hip. Denies hitting head. C/o L hip pain.

## 2017-12-21 DIAGNOSIS — H401133 Primary open-angle glaucoma, bilateral, severe stage: Secondary | ICD-10-CM | POA: Diagnosis not present

## 2017-12-27 DIAGNOSIS — Z23 Encounter for immunization: Secondary | ICD-10-CM | POA: Diagnosis not present

## 2017-12-31 DIAGNOSIS — H401133 Primary open-angle glaucoma, bilateral, severe stage: Secondary | ICD-10-CM | POA: Diagnosis not present

## 2018-01-01 DIAGNOSIS — L821 Other seborrheic keratosis: Secondary | ICD-10-CM | POA: Diagnosis not present

## 2018-01-01 DIAGNOSIS — D692 Other nonthrombocytopenic purpura: Secondary | ICD-10-CM | POA: Diagnosis not present

## 2018-01-01 DIAGNOSIS — Z85828 Personal history of other malignant neoplasm of skin: Secondary | ICD-10-CM | POA: Diagnosis not present

## 2018-01-01 DIAGNOSIS — L400 Psoriasis vulgaris: Secondary | ICD-10-CM | POA: Diagnosis not present

## 2018-01-01 DIAGNOSIS — D1801 Hemangioma of skin and subcutaneous tissue: Secondary | ICD-10-CM | POA: Diagnosis not present

## 2018-02-23 ENCOUNTER — Inpatient Hospital Stay (HOSPITAL_COMMUNITY): Payer: Medicare Other

## 2018-02-23 ENCOUNTER — Encounter (HOSPITAL_COMMUNITY): Payer: Self-pay | Admitting: Emergency Medicine

## 2018-02-23 ENCOUNTER — Inpatient Hospital Stay (HOSPITAL_COMMUNITY)
Admission: EM | Admit: 2018-02-23 | Discharge: 2018-03-01 | DRG: 436 | Disposition: A | Discharge status: Hospice | Payer: Medicare Other | Attending: Internal Medicine | Admitting: Internal Medicine

## 2018-02-23 ENCOUNTER — Emergency Department (HOSPITAL_COMMUNITY): Payer: Medicare Other

## 2018-02-23 DIAGNOSIS — K921 Melena: Secondary | ICD-10-CM | POA: Diagnosis not present

## 2018-02-23 DIAGNOSIS — C787 Secondary malignant neoplasm of liver and intrahepatic bile duct: Principal | ICD-10-CM | POA: Diagnosis present

## 2018-02-23 DIAGNOSIS — M810 Age-related osteoporosis without current pathological fracture: Secondary | ICD-10-CM | POA: Diagnosis present

## 2018-02-23 DIAGNOSIS — R609 Edema, unspecified: Secondary | ICD-10-CM | POA: Diagnosis not present

## 2018-02-23 DIAGNOSIS — E7800 Pure hypercholesterolemia, unspecified: Secondary | ICD-10-CM | POA: Diagnosis not present

## 2018-02-23 DIAGNOSIS — R0902 Hypoxemia: Secondary | ICD-10-CM | POA: Diagnosis not present

## 2018-02-23 DIAGNOSIS — J441 Chronic obstructive pulmonary disease with (acute) exacerbation: Secondary | ICD-10-CM | POA: Diagnosis present

## 2018-02-23 DIAGNOSIS — C7951 Secondary malignant neoplasm of bone: Secondary | ICD-10-CM | POA: Diagnosis not present

## 2018-02-23 DIAGNOSIS — Z885 Allergy status to narcotic agent status: Secondary | ICD-10-CM

## 2018-02-23 DIAGNOSIS — N281 Cyst of kidney, acquired: Secondary | ICD-10-CM | POA: Diagnosis not present

## 2018-02-23 DIAGNOSIS — R404 Transient alteration of awareness: Secondary | ICD-10-CM | POA: Diagnosis not present

## 2018-02-23 DIAGNOSIS — D649 Anemia, unspecified: Secondary | ICD-10-CM | POA: Diagnosis not present

## 2018-02-23 DIAGNOSIS — Z79899 Other long term (current) drug therapy: Secondary | ICD-10-CM | POA: Diagnosis not present

## 2018-02-23 DIAGNOSIS — Z66 Do not resuscitate: Secondary | ICD-10-CM | POA: Diagnosis present

## 2018-02-23 DIAGNOSIS — E876 Hypokalemia: Secondary | ICD-10-CM | POA: Diagnosis present

## 2018-02-23 DIAGNOSIS — R918 Other nonspecific abnormal finding of lung field: Secondary | ICD-10-CM | POA: Diagnosis not present

## 2018-02-23 DIAGNOSIS — C220 Liver cell carcinoma: Secondary | ICD-10-CM | POA: Diagnosis not present

## 2018-02-23 DIAGNOSIS — K7689 Other specified diseases of liver: Secondary | ICD-10-CM | POA: Diagnosis not present

## 2018-02-23 DIAGNOSIS — C3431 Malignant neoplasm of lower lobe, right bronchus or lung: Secondary | ICD-10-CM | POA: Diagnosis present

## 2018-02-23 DIAGNOSIS — N179 Acute kidney failure, unspecified: Secondary | ICD-10-CM | POA: Diagnosis not present

## 2018-02-23 DIAGNOSIS — R7401 Elevation of levels of liver transaminase levels: Secondary | ICD-10-CM

## 2018-02-23 DIAGNOSIS — F17210 Nicotine dependence, cigarettes, uncomplicated: Secondary | ICD-10-CM | POA: Diagnosis present

## 2018-02-23 DIAGNOSIS — D638 Anemia in other chronic diseases classified elsewhere: Secondary | ICD-10-CM | POA: Diagnosis present

## 2018-02-23 DIAGNOSIS — D62 Acute posthemorrhagic anemia: Secondary | ICD-10-CM | POA: Diagnosis not present

## 2018-02-23 DIAGNOSIS — H409 Unspecified glaucoma: Secondary | ICD-10-CM | POA: Diagnosis present

## 2018-02-23 DIAGNOSIS — R1310 Dysphagia, unspecified: Secondary | ICD-10-CM | POA: Diagnosis present

## 2018-02-23 DIAGNOSIS — Z681 Body mass index (BMI) 19 or less, adult: Secondary | ICD-10-CM | POA: Diagnosis not present

## 2018-02-23 DIAGNOSIS — R64 Cachexia: Secondary | ICD-10-CM | POA: Diagnosis not present

## 2018-02-23 DIAGNOSIS — R0602 Shortness of breath: Secondary | ICD-10-CM

## 2018-02-23 DIAGNOSIS — R000 Tachycardia, unspecified: Secondary | ICD-10-CM | POA: Diagnosis not present

## 2018-02-23 DIAGNOSIS — J449 Chronic obstructive pulmonary disease, unspecified: Secondary | ICD-10-CM | POA: Diagnosis not present

## 2018-02-23 DIAGNOSIS — R740 Nonspecific elevation of levels of transaminase and lactic acid dehydrogenase [LDH]: Secondary | ICD-10-CM

## 2018-02-23 DIAGNOSIS — D696 Thrombocytopenia, unspecified: Secondary | ICD-10-CM | POA: Diagnosis present

## 2018-02-23 DIAGNOSIS — R42 Dizziness and giddiness: Secondary | ICD-10-CM | POA: Diagnosis not present

## 2018-02-23 DIAGNOSIS — I10 Essential (primary) hypertension: Secondary | ICD-10-CM | POA: Diagnosis present

## 2018-02-23 DIAGNOSIS — Z881 Allergy status to other antibiotic agents status: Secondary | ICD-10-CM | POA: Diagnosis not present

## 2018-02-23 DIAGNOSIS — Z7951 Long term (current) use of inhaled steroids: Secondary | ICD-10-CM

## 2018-02-23 DIAGNOSIS — Z720 Tobacco use: Secondary | ICD-10-CM | POA: Diagnosis not present

## 2018-02-23 DIAGNOSIS — E870 Hyperosmolality and hypernatremia: Secondary | ICD-10-CM | POA: Diagnosis not present

## 2018-02-23 DIAGNOSIS — R748 Abnormal levels of other serum enzymes: Secondary | ICD-10-CM | POA: Diagnosis not present

## 2018-02-23 DIAGNOSIS — Z515 Encounter for palliative care: Secondary | ICD-10-CM | POA: Diagnosis present

## 2018-02-23 DIAGNOSIS — R4182 Altered mental status, unspecified: Secondary | ICD-10-CM | POA: Diagnosis not present

## 2018-02-23 LAB — IRON AND TIBC
Iron: 86 ug/dL (ref 28–170)
Saturation Ratios: 36 % — ABNORMAL HIGH (ref 10.4–31.8)
TIBC: 237 ug/dL — ABNORMAL LOW (ref 250–450)
UIBC: 151 ug/dL

## 2018-02-23 LAB — BASIC METABOLIC PANEL
ANION GAP: 20 — AB (ref 5–15)
BUN: 115 mg/dL — ABNORMAL HIGH (ref 8–23)
CALCIUM: 9 mg/dL (ref 8.9–10.3)
CO2: 25 mmol/L (ref 22–32)
Chloride: 98 mmol/L (ref 98–111)
Creatinine, Ser: 1.96 mg/dL — ABNORMAL HIGH (ref 0.44–1.00)
GFR, EST AFRICAN AMERICAN: 27 mL/min — AB (ref >60)
GFR, EST NON AFRICAN AMERICAN: 23 mL/min — AB (ref >60)
Glucose, Bld: 160 mg/dL — ABNORMAL HIGH (ref 70–99)
Potassium: 2.7 mmol/L — CL (ref 3.5–5.1)
Sodium: 143 mmol/L (ref 135–145)

## 2018-02-23 LAB — BRAIN NATRIURETIC PEPTIDE: B NATRIURETIC PEPTIDE 5: 231.7 pg/mL — AB (ref 0.0–100.0)

## 2018-02-23 LAB — URINALYSIS, ROUTINE W REFLEX MICROSCOPIC
Bilirubin Urine: NEGATIVE
Glucose, UA: NEGATIVE mg/dL
Ketones, ur: NEGATIVE mg/dL
Nitrite: POSITIVE — AB
PH: 5 (ref 5.0–8.0)
Protein, ur: NEGATIVE mg/dL
SPECIFIC GRAVITY, URINE: 1.015 (ref 1.005–1.030)

## 2018-02-23 LAB — HEPATIC FUNCTION PANEL
ALK PHOS: 183 U/L — AB (ref 38–126)
ALT: 122 U/L — ABNORMAL HIGH (ref 0–44)
AST: 160 U/L — ABNORMAL HIGH (ref 15–41)
Albumin: 3 g/dL — ABNORMAL LOW (ref 3.5–5.0)
BILIRUBIN DIRECT: 0.7 mg/dL — AB (ref 0.0–0.2)
BILIRUBIN INDIRECT: 0.9 mg/dL (ref 0.3–0.9)
TOTAL PROTEIN: 5.4 g/dL — AB (ref 6.5–8.1)
Total Bilirubin: 1.6 mg/dL — ABNORMAL HIGH (ref 0.3–1.2)

## 2018-02-23 LAB — RESPIRATORY PANEL BY PCR
ADENOVIRUS-RVPPCR: NOT DETECTED
Bordetella pertussis: NOT DETECTED
CORONAVIRUS NL63-RVPPCR: NOT DETECTED
Chlamydophila pneumoniae: NOT DETECTED
Coronavirus 229E: NOT DETECTED
Coronavirus HKU1: NOT DETECTED
Coronavirus OC43: NOT DETECTED
Influenza A: NOT DETECTED
Influenza B: NOT DETECTED
Metapneumovirus: NOT DETECTED
Mycoplasma pneumoniae: NOT DETECTED
Parainfluenza Virus 1: NOT DETECTED
Parainfluenza Virus 2: NOT DETECTED
Parainfluenza Virus 3: NOT DETECTED
Parainfluenza Virus 4: NOT DETECTED
RHINOVIRUS / ENTEROVIRUS - RVPPCR: NOT DETECTED
Respiratory Syncytial Virus: NOT DETECTED

## 2018-02-23 LAB — POC OCCULT BLOOD, ED: Fecal Occult Bld: POSITIVE — AB

## 2018-02-23 LAB — CBC
HCT: 30.6 % — ABNORMAL LOW (ref 36.0–46.0)
Hemoglobin: 9.5 g/dL — ABNORMAL LOW (ref 12.0–15.0)
MCH: 29.9 pg (ref 26.0–34.0)
MCHC: 31 g/dL (ref 30.0–36.0)
MCV: 96.2 fL (ref 80.0–100.0)
NRBC: 0.3 % — AB (ref 0.0–0.2)
PLATELETS: 62 10*3/uL — AB (ref 150–400)
RBC: 3.18 MIL/uL — AB (ref 3.87–5.11)
RDW: 15.7 % — ABNORMAL HIGH (ref 11.5–15.5)
WBC: 15.7 10*3/uL — ABNORMAL HIGH (ref 4.0–10.5)

## 2018-02-23 LAB — I-STAT TROPONIN, ED: TROPONIN I, POC: 0.05 ng/mL (ref 0.00–0.08)

## 2018-02-23 LAB — PROTIME-INR
INR: 1.18
Prothrombin Time: 14.9 seconds (ref 11.4–15.2)

## 2018-02-23 LAB — CBG MONITORING, ED: Glucose-Capillary: 130 mg/dL — ABNORMAL HIGH (ref 70–99)

## 2018-02-23 LAB — DIFFERENTIAL
Abs Immature Granulocytes: 0.09 10*3/uL — ABNORMAL HIGH (ref 0.00–0.07)
BASOS PCT: 0 %
Basophils Absolute: 0 10*3/uL (ref 0.0–0.1)
Eosinophils Absolute: 0 10*3/uL (ref 0.0–0.5)
Eosinophils Relative: 0 %
Immature Granulocytes: 1 %
Lymphocytes Relative: 2 %
Lymphs Abs: 0.3 10*3/uL — ABNORMAL LOW (ref 0.7–4.0)
Monocytes Absolute: 0.7 10*3/uL (ref 0.1–1.0)
Monocytes Relative: 5 %
NEUTROS ABS: 13.1 10*3/uL — AB (ref 1.7–7.7)
Neutrophils Relative %: 92 %

## 2018-02-23 LAB — TYPE AND SCREEN
ABO/RH(D): A POS
ANTIBODY SCREEN: NEGATIVE

## 2018-02-23 LAB — FERRITIN: Ferritin: 1484 ng/mL — ABNORMAL HIGH (ref 11–307)

## 2018-02-23 LAB — MAGNESIUM: Magnesium: 2.4 mg/dL (ref 1.7–2.4)

## 2018-02-23 MED ORDER — SODIUM CHLORIDE 0.9 % IV SOLN
500.0000 mg | INTRAVENOUS | Status: DC
  Administered 2018-02-23 – 2018-02-24 (×2): 500 mg via INTRAVENOUS
  Filled 2018-02-23 (×2): qty 500

## 2018-02-23 MED ORDER — SODIUM CHLORIDE 0.9 % IV SOLN
INTRAVENOUS | Status: DC
  Administered 2018-02-23 – 2018-02-24 (×2): via INTRAVENOUS

## 2018-02-23 MED ORDER — IPRATROPIUM-ALBUTEROL 0.5-2.5 (3) MG/3ML IN SOLN: 3.0000 mL | Freq: Four times a day (QID) | RESPIRATORY_TRACT | Status: DC | PRN

## 2018-02-23 MED ORDER — PANTOPRAZOLE SODIUM 40 MG IV SOLR
40.0000 mg | Freq: Two times a day (BID) | INTRAVENOUS | Status: DC
  Administered 2018-02-23 – 2018-02-24 (×2): 40 mg via INTRAVENOUS
  Filled 2018-02-23 (×2): qty 40

## 2018-02-23 MED ORDER — IPRATROPIUM-ALBUTEROL 0.5-2.5 (3) MG/3ML IN SOLN
3.0000 mL | Freq: Once | RESPIRATORY_TRACT | Status: AC
  Administered 2018-02-23: 3 mL via RESPIRATORY_TRACT
  Filled 2018-02-23: qty 3

## 2018-02-23 MED ORDER — POTASSIUM CHLORIDE 10 MEQ/100ML IV SOLN
10.0000 meq | Freq: Once | INTRAVENOUS | Status: AC
  Administered 2018-02-23: 10 meq via INTRAVENOUS
  Filled 2018-02-23: qty 100

## 2018-02-23 MED ORDER — SODIUM CHLORIDE 3 % IN NEBU: 4.0000 mL | INHALATION_SOLUTION | Freq: Three times a day (TID) | RESPIRATORY_TRACT | Status: DC

## 2018-02-23 MED ORDER — POTASSIUM CHLORIDE CRYS ER 20 MEQ PO TBCR
40.0000 meq | EXTENDED_RELEASE_TABLET | Freq: Once | ORAL | Status: AC
Start: 2018-02-23 — End: 2018-02-23
  Administered 2018-02-23: 40 meq via ORAL
  Filled 2018-02-23: qty 2

## 2018-02-23 MED ORDER — SODIUM CHLORIDE 3 % IN NEBU
4.0000 mL | INHALATION_SOLUTION | Freq: Three times a day (TID) | RESPIRATORY_TRACT | Status: DC
  Filled 2018-02-23 (×2): qty 4

## 2018-02-23 MED ORDER — DM-GUAIFENESIN ER 30-600 MG PO TB12
2.0000 | ORAL_TABLET | Freq: Two times a day (BID) | ORAL | Status: DC
  Administered 2018-02-23 – 2018-02-27 (×8): 2 via ORAL
  Filled 2018-02-23 (×8): qty 2

## 2018-02-23 MED ORDER — IPRATROPIUM-ALBUTEROL 0.5-2.5 (3) MG/3ML IN SOLN
3.0000 mL | Freq: Four times a day (QID) | RESPIRATORY_TRACT | Status: DC
  Administered 2018-02-23: 3 mL via RESPIRATORY_TRACT
  Filled 2018-02-23 (×2): qty 3

## 2018-02-23 MED ORDER — NICOTINE 21 MG/24HR TD PT24
21.0000 mg | MEDICATED_PATCH | Freq: Every day | TRANSDERMAL | Status: DC
  Administered 2018-02-24 – 2018-02-28 (×5): 21 mg via TRANSDERMAL
  Filled 2018-02-23 (×5): qty 1

## 2018-02-23 MED ORDER — SODIUM CHLORIDE 0.9 % IV BOLUS
500.0000 mL | Freq: Once | INTRAVENOUS | Status: AC
  Administered 2018-02-23: 500 mL via INTRAVENOUS

## 2018-02-23 NOTE — ED Provider Notes (Signed)
Care assumed from previous provider PA Joy. Please see note for further details. Case discussed, plan agreed upon. Briefly, patient is a 84 y.o. female who presented to the ER for generalized weakness and shortness of breath worsening over the week.  Associated with melanotic stool with positive Hemoccult.  Plan for consultation with GI and medicine.  Labs reviewed independently by me and quite concerning with hypokalemia of 2.7 (replenished by previous provider), new AKI with elevated anion gap, elevated liver enzymes and anemia. ? TTP with thrombocytopenia platelet count of 62.  No recent laboratory values for comparison, but was in the 230s about 6 years ago.  [facility], Dr. Alessandra Bevels, consulted who recommended Protonix 40 mg twice daily, clear liquid diet until midnight then n.p.o. after midnight.  GI will follow in consultation.  Discussed with hospitalist who will admit.   Ward, Ozella Almond, PA-C 01/25/2018 1737    Quintella Reichert, MD 02/24/18 [phone]

## 2018-02-23 NOTE — ED Notes (Signed)
Pt ambulated to RR and back with me and a family member pt did well.

## 2018-02-23 NOTE — ED Notes (Signed)
Differential redraw perfomed by this RN and sent down to lab.

## 2018-02-23 NOTE — ED Notes (Signed)
K level 2.7

## 2018-02-23 NOTE — ED Provider Notes (Signed)
[facility] EM[facility]Y DEPARTMENT Provider Note   CSN: [id number] Arrival date & time: 02/05/2018  1309     History   Chief Complaint Chief Complaint  Patient presents with  . Shortness of Breath  . Weakness    HPI Jenny Sexton is a 84 y.o. female.  HPI  Jenny Sexton is a 84 y.o. female, with a history of COPD, HTN, and hypercholesterolemia, presenting to the ED with shortness of breath for the last week.  Worsening cough over the past week. Generalized weakness, lightheadedness, and instability on her feet over the past month, but worse over the past several days. Later in the interactions with this patient, she admits to having dark stools for the past couple months.  Denies routine NSAID use or alcohol use, but is a tobacco user. Denies fever/chills, chest pain, abdominal pain, N/V/D, urinary symptoms, orthopnea, or any other complaints.   Past Medical History:  Diagnosis Date  . Cardiomegaly    Stable  . Chest pain    Occasional non cardiac  . COPD (chronic obstructive pulmonary disease) ([place])    With ongoing smoking  . Glaucoma   . Hypercholesterolemia   . Hypertension    ESSENTIAL  . Osteoporosis   . Pelvic relaxation    with pessary  . Psoriasis   . Tobacco abuse     Patient Active Problem List   Diagnosis Date Noted  . Hypercholesterolemia 12/27/2010  . Benign hypertensive heart disease without heart failure 12/27/2010  . Psoriasis 12/27/2010  . Tobacco abuse 12/27/2010    Past Surgical History:  Procedure Laterality Date  . OOPHORECTOMY Left   . TONSILLECTOMY       OB History   None      Home Medications    Prior to Admission medications   Medication Sig Start Date End Date Taking? Authorizing Provider  atenolol (TENORMIN) 50 MG tablet Take 1 tablet (50 mg total) by mouth daily. 06/21/11  Yes Darlin Coco, MD  brimonidine (ALPHAGAN P) 0.1 % SOLN 1 drop both eyes twice a day    Yes [provider]    cetirizine (ZYRTEC) 10 MG tablet Take 10 mg by mouth daily.   Yes [provider]  dorzolamide-timolol (COSOPT) 22.3-6.8 MG/ML ophthalmic solution 1 drop 2 (two) times daily.     Yes [provider]  Multiple Vitamins-Minerals (MULTIVITAMIN ADULT PO) Take by mouth.   Yes [provider]  Travoprost, BAK Free, (TRAVATAMN) 0.004 % SOLN ophthalmic solution 1 drop at bedtime.     Yes [provider]  triamterene-hydrochlorothiazide (DYAZIDE) 37.5-25 MG per capsule Take 1 each (1 capsule total) by mouth daily. 1 tab daily 06/21/11  Yes Darlin Coco, MD  VENTOLIN HFA 108 (90 Base) MCG/ACT inhaler Inhale 2 puffs into the lungs every 6 (six) hours as needed. 02/11/18  Yes [provider]  atorvastatin (LIPITOR) 10 MG tablet Take 1 tablet (10 mg total) by mouth daily. Patient not taking: Reported on 01/28/2018 04/27/11   Darlin Coco, MD    Family History Family History  Problem Relation Age of Onset  . Heart attack Father   . Cancer Mother     Social History Social History   Tobacco Use  . Smoking status: Smoker, Current Status Unknown    Packs/day: 1.00  . Smokeless tobacco: Never Used  Substance Use Topics  . Alcohol use: Never    Frequency: Never  . Drug use: Never     Allergies   Amoxicillin  and Codeine   Review of Systems Review of Systems  Constitutional: Negative for chills, diaphoresis and fever.  Respiratory: Positive for cough and shortness of breath.   Cardiovascular: Positive for leg swelling. Negative for chest pain.  Gastrointestinal: Negative for abdominal pain, diarrhea, nausea and vomiting.  All other systems reviewed and are negative.    Physical Exam Updated Vital Signs BP 99/67   Pulse 75   Temp (!) 96.2 F (35.7 C) (Axillary)   Resp 20   SpO2 95%   Physical Exam  Constitutional: She appears well-developed and well-nourished. No distress.  HENT:  Head: Normocephalic and atraumatic.   Mouth/Throat: Mucous membranes are dry.  Eyes: Conjunctivae are normal.  Neck: Neck supple.  Cardiovascular: Normal rate, regular rhythm, normal heart sounds and intact distal pulses.  Pulmonary/Chest: Tachypnea noted. She has decreased breath sounds. She has wheezes.  Abdominal: Soft. There is no tenderness. There is no guarding.  Genitourinary: Rectal exam shows guaiac positive stool.  Genitourinary Comments: Pilar Plate melanotic stool.  No hematochezia. RN, Jerene Pitch, served as Producer, television/film/video.  Musculoskeletal: She exhibits edema.  Bilateral, pitting lower extremity edema  Lymphadenopathy:    She has no cervical adenopathy.  Neurological: She is alert.  Skin: Skin is warm and dry. She is not diaphoretic.  Psychiatric: She has a normal mood and affect. Her behavior is normal.  Nursing note and vitals reviewed.    ED Treatments / Results  Labs (all labs ordered are listed, but only abnormal results are displayed) Labs Reviewed  BASIC METABOLIC PANEL - Abnormal; Notable for the following components:      Result Value   Potassium 2.7 (*)    Glucose, Bld 160 (*)    BUN 115 (*)    Creatinine, Ser 1.96 (*)    GFR calc non Af Amer 23 (*)    GFR calc Af Amer 27 (*)    Anion gap 20 (*)    All other components within normal limits  CBC - Abnormal; Notable for the following components:   WBC 15.7 (*)    RBC 3.18 (*)    Hemoglobin 9.5 (*)    HCT 30.6 (*)    RDW 15.7 (*)    Platelets 62 (*)    nRBC 0.3 (*)    All other components within normal limits  BRAIN NATRIURETIC PEPTIDE - Abnormal; Notable for the following components:   B Natriuretic Peptide 231.7 (*)    All other components within normal limits  HEPATIC FUNCTION PANEL - Abnormal; Notable for the following components:   Total Protein 5.4 (*)    Albumin 3.0 (*)    AST 160 (*)    ALT 122 (*)    Alkaline Phosphatase 183 (*)    Total Bilirubin 1.6 (*)    Bilirubin, Direct 0.7 (*)    All other components within normal limits  CBG  MONITORING, ED - Abnormal; Notable for the following components:   Glucose-Capillary 130 (*)    All other components within normal limits  POC OCCULT BLOOD, ED - Abnormal; Notable for the following components:   Fecal Occult Bld POSITIVE (*)    All other components within normal limits  MAGNESIUM  PROTIME-INR  URINALYSIS, ROUTINE W REFLEX MICROSCOPIC  I-STAT TROPONIN, ED  TYPE AND SCREEN    Hemoglobin  Date Value Ref Range Status  01/29/2018 9.5 (L) 12.0 - 15.0 g/dL Final  04/28/2011 12.0 12.0 - 15.0 g/dL Final  07/07/2009 9.9 (L) 12.0 - 15.0 g/dL Final  07/01/2009 12.6 12.0 -  15.0 g/dL Final   ALT  Date Value Ref Range Status  02/20/2018 122 (H) 0 - 44 U/L Final  04/28/2011 17 0 - 35 U/L Final  12/27/2010 22 0 - 35 U/L Final  07/01/2009 19 0 - 35 U/L Final    AST  Date Value Ref Range Status  02/10/2018 160 (H) 15 - 41 U/L Final  04/28/2011 25 0 - 37 U/L Final  12/27/2010 26 0 - 37 U/L Final  07/01/2009 24 0 - 37 U/L Final    EKG EKG Interpretation  Date/Time:  Saturday February 23 2018 13:13:17 EST Ventricular Rate:  73 PR Interval:    QRS Duration: 87 QT Interval:  460 QTC Calculation: 507 R Axis:   82 Text Interpretation:  Sinus rhythm Ventricular bigeminy Borderline right axis deviation Anteroseptal infarct, old Repol abnrm suggests ischemia, diffuse leads Prolonged QT interval Confirmed by Davonna Belling [phone]) on 01/26/2018 4:08:39 PM   Radiology Dg Chest 2 View  Result Date: 02/12/2018 CLINICAL DATA:  Generalized weakness with dizziness and shortness of breath 1 week. Smoker. EXAM: CHEST - 2 VIEW COMPARISON:  07/13/2011 FINDINGS: Lungs are somewhat hyperexpanded without focal airspace consolidation or effusion. Cardiomediastinal silhouette is within normal. There is prominence of the right hilum with somewhat lobulated density over the infrahilar region on the lateral film as findings may be due to adenopathy/mass. Remainder of the exam is unchanged.  IMPRESSION: No acute cardiopulmonary disease. Increased prominence of the right hilum with possible adenopathy/mass in the infrahilar region on the lateral film. Recommend contrast-enhanced chest CT for further evaluation. Electronically Signed   By: Marin Olp M.D.   On: 01/31/2018 14:09    Procedures Procedures (including critical care time)  Medications Ordered in ED Medications  pantoprazole (PROTONIX) injection 40 mg (has no administration in time range)  ipratropium-albuterol (DUONEB) 0.5-2.5 (3) MG/3ML nebulizer solution 3 mL (3 mLs Nebulization Given 02/15/2018 1415)  sodium chloride 0.9 % bolus 500 mL (0 mLs Intravenous Stopped 02/04/2018 1525)  potassium chloride SA (K-DUR,KLOR-CON) CR tablet 40 mEq (40 mEq Oral Given 02/13/2018 1444)  potassium chloride 10 mEq in 100 mL IVPB (10 mEq Intravenous New Bag/Given 02/11/2018 1447)     Initial Impression / Assessment and Plan / ED Course  I have reviewed the triage vital signs and the nursing notes.  Pertinent labs & imaging results that were available during my care of the patient were reviewed by me and considered in my medical decision making (see chart for details).  Clinical Course as of Feb 24 1715  Sat Feb 23, 2018  1710 Spoke with Dr. Alessandra Bevels, GI.  Advises patient can be put on clear liquids until midnight.  N.p.o. after midnight.  40 mg Protonix twice daily IV.   [SJ]    Clinical Course User Index [SJ] Solana Coggin C, PA-C    Patient presents with generalized weakness and shortness of breath. Frank melena on rectal exam.  Hemoglobin 9.5 with most recent comparison 6 years ago.  Elevated BUN. Elevated liver enzymes.  Hypokalemia.  AKI.  Elevated anion gap.  Leukocytosis.  Findings and plan of care discussed with Davonna Belling, MD.   End of shift patient care handoff report given to [facility], PA-C. Plan: Admit via medicine service.  Final Clinical Impressions(s) / ED Diagnoses   Final diagnoses:  Shortness of  breath  Melena    ED Discharge Orders    None       Lorayne Bender, PA-C 02/13/2018 1716  Davonna Belling, MD 02/24/18 1525

## 2018-02-23 NOTE — ED Notes (Signed)
Main lab called to add on BNP.

## 2018-02-23 NOTE — H&P (Signed)
History and Physical  Jenny Sexton WCH:[id number] DOB: 05-16-1931 DOA: 02/19/2018  Referring physician: Dr Leonides Schanz  PCP: Shon Baton, MD  Outpatient Specialists: [facility] Patient coming from: Home  Chief Complaint: Worsening shortness of breath and generalized weakness  HPI: Jenny Sexton is a 84 y.o. female with past medical history significant for essential hypertension, hyperlipidemia, 60+ current tobacco user, history of colonic polyps and family history of colon cancer in her mother who presented to [facility] ED accompanied by her granddaughter with complaints of worsening shortness of breath and generalized weakness of 2 weeks duration.  Associated with dark stools and intermittent dizziness.  Patient is a poor historian.  Majority of the history is obtained from her granddaughter and daughter in the room.  Patient lives independently in her own home and is independent of all her ADLs including managing her own finances, her own rental properties, and driving.  Today patient was too tired to go to her hair appointment which she never misses.  Her granddaughter noted that she looked jaundiced and was struggling to breathe.  Decided to bring her to the ED for further evaluation.  To note, patient reports she recently had a colonoscopy done at [facility] and it was unremarkable.  Endorses unintentional weight loss 10 to 20 pounds.  60+ current smoker.  Mother deceased shortly after diagnosis of colon cancer.    ED Course: Upon presentation to the ED, vital signs remarkable for O2 saturation 87% on room air.  Lab studies remarkable for hemoglobin 9.5 with baseline of 12.  Positive FOBT.  Leukocytosis with WBC 15.7K.  Elevated creatinine 1.96 with normal baseline.  GI was consulted by ED physician.  TRH asked to admit.  Review of Systems: Review of systems as noted in the HPI. All other systems reviewed and are negative.   Past Medical History:  Diagnosis Date  . Cardiomegaly    Stable  . Chest pain     Occasional non cardiac  . COPD (chronic obstructive pulmonary disease) ([place])    With ongoing smoking  . Glaucoma   . Hypercholesterolemia   . Hypertension    ESSENTIAL  . Osteoporosis   . Pelvic relaxation    with pessary  . Psoriasis   . Tobacco abuse    Past Surgical History:  Procedure Laterality Date  . OOPHORECTOMY Left   . TONSILLECTOMY      Social History:  reports that she has been smoking. She has been smoking about 1.00 pack per day. She has never used smokeless tobacco. She reports that she does not drink alcohol or use drugs.   Allergies  Allergen Reactions  . Amoxicillin   . Codeine     nausea    Family History  Problem Relation Age of Onset  . Heart attack Father   . Cancer Mother     Mother passed away after being diagnosed with colon cancer.  Prior to Admission medications   Medication Sig Start Date End Date Taking? Authorizing Provider  atenolol (TENORMIN) 50 MG tablet Take 1 tablet (50 mg total) by mouth daily. 06/21/11  Yes Darlin Coco, MD  brimonidine (ALPHAGAN P) 0.1 % SOLN 1 drop both eyes twice a day    Yes [provider]  cetirizine (ZYRTEC) 10 MG tablet Take 10 mg by mouth daily.   Yes [provider]  dorzolamide-timolol (COSOPT) 22.3-6.8 MG/ML ophthalmic solution 1 drop 2 (two) times daily.     Yes [provider]  Multiple Vitamins-Minerals (MULTIVITAMIN ADULT  PO) Take by mouth.   Yes [provider]  Travoprost, BAK Free, (TRAVATAMN) 0.004 % SOLN ophthalmic solution 1 drop at bedtime.     Yes [provider]  triamterene-hydrochlorothiazide (DYAZIDE) 37.5-25 MG per capsule Take 1 each (1 capsule total) by mouth daily. 1 tab daily 06/21/11  Yes Darlin Coco, MD  VENTOLIN HFA 108 (90 Base) MCG/ACT inhaler Inhale 2 puffs into the lungs every 6 (six) hours as needed. 02/11/18  Yes [provider]  atorvastatin (LIPITOR) 10 MG tablet Take 1 tablet (10 mg total) by mouth  daily. Patient not taking: Reported on 02/17/2018 04/27/11   Darlin Coco, MD    Physical Exam: BP 115/82   Pulse 72   Temp (!) 96.2 F (35.7 C) (Axillary)   Resp 15   SpO2 93%   . General: 84 y.o. year-old female thin built in no acute distress.  Alert and oriented x3. . Cardiovascular: Regular rate and rhythm with no rubs or gallops.  No thyromegaly or JVD noted.  No lower extremity edema. 2/4 pulses in all 4 extremities. Marland Kitchen Respiratory: Clear to auscultation with no wheezes or rales. Good inspiratory effort. . Abdomen: Soft nontender nondistended with normal bowel sounds x4 quadrants. . Muskuloskeletal: No cyanosis, clubbing or edema noted bilaterally . Neuro: CN II-XII intact, strength, sensation, reflexes . Skin: No ulcerative lesions noted or rashes.  Mildly jaundice. Marland Kitchen Psychiatry: Judgement and insight appear normal. Mood is appropriate for condition and setting          Labs on Admission:  Basic Metabolic Panel: Recent Labs  Lab 02/03/2018 1321 02/12/2018 1448  NA 143  --   K 2.7*  --   CL 98  --   CO2 25  --   GLUCOSE 160*  --   BUN 115*  --   CREATININE 1.96*  --   CALCIUM 9.0  --   MG  --  2.4   Liver Function Tests: Recent Labs  Lab 02/17/2018 1448  AST 160*  ALT 122*  ALKPHOS 183*  BILITOT 1.6*  PROT 5.4*  ALBUMIN 3.0*   No results for input(s): LIPASE, AMYLASE in the last 168 hours. No results for input(s): AMMONIA in the last 168 hours. CBC: Recent Labs  Lab 02/10/2018 1321  WBC 15.7*  HGB 9.5*  HCT 30.6*  MCV 96.2  PLT 62*   Cardiac Enzymes: No results for input(s): CKTOTAL, CKMB, CKMBINDEX, TROPONINI in the last 168 hours.  BNP (last 3 results) Recent Labs    02/17/2018 1321  BNP 231.7*    ProBNP (last 3 results) No results for input(s): PROBNP in the last 8760 hours.  CBG: Recent Labs  Lab 02/17/2018 1407  GLUCAP 130*    Radiological Exams on Admission: Dg Chest 2 View  Result Date: 02/17/2018 CLINICAL DATA:  Generalized  weakness with dizziness and shortness of breath 1 week. Smoker. EXAM: CHEST - 2 VIEW COMPARISON:  07/13/2011 FINDINGS: Lungs are somewhat hyperexpanded without focal airspace consolidation or effusion. Cardiomediastinal silhouette is within normal. There is prominence of the right hilum with somewhat lobulated density over the infrahilar region on the lateral film as findings may be due to adenopathy/mass. Remainder of the exam is unchanged. IMPRESSION: No acute cardiopulmonary disease. Increased prominence of the right hilum with possible adenopathy/mass in the infrahilar region on the lateral film. Recommend contrast-enhanced chest CT for further evaluation. Electronically Signed   By: Marin Olp M.D.   On: 02/20/2018 14:09    EKG: I independently viewed  the EKG done and my findings are as followed: Sinus rhythm rate of 76 and ventricular bigeminy's.  QTC prolongation 507.  Assessment/Plan Present on Admission: . Symptomatic anemia  Active Problems:   Symptomatic anemia  Symptomatic anemia suspect secondary to upper GI bleed Endorses melena intermittently in the last 2 weeks Positive FOBT in the ED Presented with hemoglobin 9.5 with baseline of 12 Self-reported recent colonoscopy.  States it was unremarkable GI has been consulted by ED physician Continue PPI Continue IV hydration Closely monitor vital signs  Hold antihypertensive medications to avoid hypotension in the setting of suspected acute upper GI bleed Monitor H&H Transfuse if hemoglobin drops less than 7.0 Clear liquid diet as recommended by GI N.p.o. after midnight for possible procedure (endoscopy)  Acute blood loss anemia secondary to suspected upper GI bleed Baseline hemoglobin appears to be 12 Hemoglobin on presentation was 9.5 Continue to monitor for any changes  AKI States she did not have history of CKD From previous medical record last creatinine was 1.0 in 2013 Creatinine on presentation 1.96 Avoid  nephrotoxic agents/dehydration/hypotension We will start IV fluid and monitor urine output Repeat BMP in the morning  Acute transaminitis/hyperbilirubinemia LFTs are elevated Total bilirubin 1.6 No history of alcohol use Repeat chemistry panel in the morning Obtain hepatitis panel May consider abdominal ultrasound  COPD with acute exacerbation/intermittent acute hypoxia Maintain O2 saturation greater than 92% Not on oxygen at baseline Chronic cough greater than 4 weeks Independently reviewed chest x-ray done in the ED which did not reveal any lobular infiltrates Obtain procalcitonin Obtain viral panel and influenza PCR Treat with IV azithromycin and pulmonary toilet Hypersaline nebs 3 times daily and Mucinex 1200 mg twice daily DuoNebs every 6 hours  Suspected lesion incidentally found on chest x-ray Obtain CT chest to further assess 60+ current tobacco user Unintentional weight loss  Tobacco use disorder Tobacco cessation counseling done at bedside 60+ current tobacco user Nicotine patch  Essential hypertension Blood pressure stable Avoid hypotension in the setting of suspected acute GI bleed Hold all antihypertensive medications for now  Thrombocytopenia No prior records to compare Platelets 62K on presentation Obtain abdominal ultrasound Continue to monitor and obtain CBC in the morning  Unintentional weight loss Self-reported 10 to 20 pounds in 3 to 4 months Work-up pending Awaiting results of CT chest and possible endoscopy by GI  Hypokalemia Potassium 2.7 Denies diarrhea or vomiting Replete as indicated Repeat BMP in the morning  QTC prolongation Avoid QTC prolonging agents Presented with QTC of 507 Repeat 12 leads EKG in the morning   Risks: Patient is high risk for decompensation due to multiple comorbidities and advanced age.  Patient will require at least 2 midnights for further evaluation and treatment of present condition.   DVT prophylaxis:  SCDs.  Hold VTE pharmacological prophylaxis due to suspected acute upper GI bleed  Code Status: DNR  Family Communication: Daughter and granddaughter at bedside.  All questions answered to their satisfaction.  Disposition Plan: Admit to stepdown unit  Consults called: GI has been consulted by ED physician  Admission status: Inpatient status    Kayleen Memos MD [facility] Pager [phone]  If 7PM-7AM, please contact night-coverage [email or web address] Password [email or web address]  01/26/2018, 6:00 PM

## 2018-02-23 NOTE — ED Triage Notes (Signed)
Per [facility]: Patient to ED from home c/o generalized weakness, dizziness, and shortness of breath x 1 week. Patient is heavy smoker with COPD, but has been cutting back d/t not feeling well. She denies chest pain, fevers/chills, N/V. Reports weak, non-productive cough. Per EMS, patient's color gray on their arrival - pink on arrival to ED. EMS VS: 113/74, CBG 189, HR 78, 93% RA, placed on 3L O2 nasal cannula - increased to 98%. Respirations e/u, skin warm/dry. A&O x 4.

## 2018-02-24 ENCOUNTER — Other Ambulatory Visit: Payer: Self-pay

## 2018-02-24 ENCOUNTER — Inpatient Hospital Stay (HOSPITAL_COMMUNITY): Payer: Medicare Other

## 2018-02-24 DIAGNOSIS — Z720 Tobacco use: Secondary | ICD-10-CM

## 2018-02-24 DIAGNOSIS — E7800 Pure hypercholesterolemia, unspecified: Secondary | ICD-10-CM

## 2018-02-24 DIAGNOSIS — R918 Other nonspecific abnormal finding of lung field: Secondary | ICD-10-CM

## 2018-02-24 DIAGNOSIS — 419620001 Death: Secondary | SNOMED CT | POA: Diagnosis not present

## 2018-02-24 LAB — CBC WITH DIFFERENTIAL/PLATELET
Abs Immature Granulocytes: 0.12 10*3/uL — ABNORMAL HIGH (ref 0.00–0.07)
Basophils Absolute: 0 10*3/uL (ref 0.0–0.1)
Basophils Relative: 0 %
Eosinophils Absolute: 0 10*3/uL (ref 0.0–0.5)
Eosinophils Relative: 0 %
HCT: 24.1 % — ABNORMAL LOW (ref 36.0–46.0)
Hemoglobin: 7.7 g/dL — ABNORMAL LOW (ref 12.0–15.0)
Immature Granulocytes: 1 %
Lymphocytes Relative: 2 %
Lymphs Abs: 0.2 10*3/uL — ABNORMAL LOW (ref 0.7–4.0)
MCH: 30.3 pg (ref 26.0–34.0)
MCHC: 32 g/dL (ref 30.0–36.0)
MCV: 94.9 fL (ref 80.0–100.0)
Monocytes Absolute: 0.7 10*3/uL (ref 0.1–1.0)
Monocytes Relative: 6 %
Neutro Abs: 10.4 10*3/uL — ABNORMAL HIGH (ref 1.7–7.7)
Neutrophils Relative %: 91 %
Platelets: 47 10*3/uL — ABNORMAL LOW (ref 150–400)
RBC: 2.54 MIL/uL — AB (ref 3.87–5.11)
RDW: 16.1 % — ABNORMAL HIGH (ref 11.5–15.5)
WBC: 11.4 10*3/uL — ABNORMAL HIGH (ref 4.0–10.5)
nRBC: 0.7 % — ABNORMAL HIGH (ref 0.0–0.2)

## 2018-02-24 LAB — COMPREHENSIVE METABOLIC PANEL
ALT: 106 U/L — ABNORMAL HIGH (ref 0–44)
AST: 152 U/L — ABNORMAL HIGH (ref 15–41)
Albumin: 2.3 g/dL — ABNORMAL LOW (ref 3.5–5.0)
Alkaline Phosphatase: 158 U/L — ABNORMAL HIGH (ref 38–126)
Anion gap: 15 (ref 5–15)
BUN: 95 mg/dL — ABNORMAL HIGH (ref 8–23)
CALCIUM: 7.7 mg/dL — AB (ref 8.9–10.3)
CO2: 26 mmol/L (ref 22–32)
Chloride: 105 mmol/L (ref 98–111)
Creatinine, Ser: 1.56 mg/dL — ABNORMAL HIGH (ref 0.44–1.00)
GFR calc Af Amer: 35 mL/min — ABNORMAL LOW (ref >60)
GFR, EST NON AFRICAN AMERICAN: 30 mL/min — AB (ref >60)
Glucose, Bld: 101 mg/dL — ABNORMAL HIGH (ref 70–99)
Potassium: 2.6 mmol/L — CL (ref 3.5–5.1)
Sodium: 146 mmol/L — ABNORMAL HIGH (ref 135–145)
Total Bilirubin: 1.6 mg/dL — ABNORMAL HIGH (ref 0.3–1.2)
Total Protein: 4 g/dL — ABNORMAL LOW (ref 6.5–8.1)

## 2018-02-24 LAB — PROCALCITONIN: Procalcitonin: 1.17 ng/mL

## 2018-02-24 LAB — ABO/RH: ABO/RH(D): A POS

## 2018-02-24 LAB — PROTIME-INR
INR: 1.37
PROTHROMBIN TIME: 16.8 s — AB (ref 11.4–15.2)

## 2018-02-24 MED ORDER — POTASSIUM CHLORIDE 10 MEQ/100ML IV SOLN
10.0000 meq | INTRAVENOUS | Status: AC
  Administered 2018-02-24 (×6): 10 meq via INTRAVENOUS
  Filled 2018-02-24 (×6): qty 100

## 2018-02-24 MED ORDER — GADOBUTROL 1 MMOL/ML IV SOLN
4.0000 mL | Freq: Once | INTRAVENOUS | Status: AC | PRN
  Administered 2018-02-24: 4 mL via INTRAVENOUS

## 2018-02-24 MED ORDER — POTASSIUM CHLORIDE CRYS ER 20 MEQ PO TBCR
40.0000 meq | EXTENDED_RELEASE_TABLET | Freq: Once | ORAL | Status: AC
  Administered 2018-02-24: 40 meq via ORAL
  Filled 2018-02-24: qty 2

## 2018-02-24 MED ORDER — PANTOPRAZOLE SODIUM 40 MG PO TBEC
40.0000 mg | DELAYED_RELEASE_TABLET | Freq: Every day | ORAL | Status: DC
  Administered 2018-02-25 – 2018-02-28 (×4): 40 mg via ORAL
  Filled 2018-02-24 (×4): qty 1

## 2018-02-24 MED ORDER — LORAZEPAM 2 MG/ML IJ SOLN: 1.0000 mg | Freq: Once | INTRAMUSCULAR | Status: DC | PRN

## 2018-02-24 NOTE — Progress Notes (Signed)
PROGRESS NOTE                                                                                                                                                                                                             Patient Demographics:    Jenny Sexton, is a 84 y.o. female, DOB - 05-11-1931, EYC:[id number]  Admit date - 02/05/2018   Admitting Physician Kayleen Memos, DO  Outpatient Primary MD for the patient is Shon Baton, MD  LOS - 1  Chief Complaint  Patient presents with  . Shortness of Breath  . Weakness       Brief Narrative  Jenny Sexton is a 84 y.o. female with past medical history significant for essential hypertension, hyperlipidemia, 60+ current tobacco user, history of colonic polyps and family history of colon cancer in her mother who presented to [facility] ED accompanied by her granddaughter with complaints of worsening shortness of breath and generalized weakness of 2 weeks duration.  Associated with dark stools and intermittent dizziness, patient reports she recently had a colonoscopy done at [facility] and it was unremarkable, also unintentional weight loss 10 to 20 pounds. 60+ current smoker. workup showed -heme positive stool, leukocytosis, anemia, ARF, CT showed - central right lower lobe bronchogenic neoplasm.   Subjective:    Jenny Sexton today has, No headache, No chest pain, No abdominal pain - No Nausea, No new weakness tingling or numbness, No Cough - SOB.    Assessment  & Plan :     1.  Possible right lower lobe bronchogenic neoplasm, possible liver mass.  Extremely frail, will get MRI liver for diagnostic purposes, so has a history of rapid weight loss highly suspicious for lung malignancy with possible mets to the liver.  Will get MRI of the liver and then proceed with biopsy for diagnostic purposes either in the liver or lung based on the location.  Discussed with daughter, she will also discuss with her family and inform us tomorrow as to what  they wish.  2.  Smoking and COPD.  Stable supportive care.  Counseled to quit smoking.  3.  Severe hypokalemia.  Replaced will monitor.  4.  Anemia with dizziness and weakness.  Some fall with dilution, anemia panel unrevealing, will monitor and trend.  5.  Weakly heme positive stool.  Placed on oral PPI and monitor.  Outpatient GI follow-up.    Family Communication  :  None  Code Status :  DNR  Disposition Plan  :  TBD  Consults  :  GI  Procedures  :    MRI Liver  CT - Moderate focal nodularity over the right lower lobe centered over the airways with the largest area of nodularity measuring 2.7 x 3.3 cm. Central right lower lobe nodule abutting the inferior pulmonary vein measuring 2 x 2.8 cm. There is moderate mediastinal and likely right hilar adenopathy. These findings may be due to central right lower lobe bronchogenic neoplasm. Recommend thoracic surgery consultation. Moderate emphysematous disease and evidence of prior granulomas disease. Aortic Atherosclerosis (ICD10-I70.0). Atherosclerotic coronary artery disease. 1.6 cm left renal cyst. Mild hepatomegaly.   DVT Prophylaxis  :    SCDs    Lab Results  Component Value Date   PLT 47 (L) 02/24/2018    Diet :  Diet Order            DIET SOFT Room service appropriate? Yes; Fluid consistency: Thin  Diet effective now               Inpatient Medications Scheduled Meds: . dextromethorphan-guaiFENesin  2 tablet Oral BID  . nicotine  21 mg Transdermal Daily  . pantoprazole (PROTONIX) IV  40 mg Intravenous Q12H   Continuous Infusions: . sodium chloride 75 mL/hr at 02/24/18 0536  . azithromycin 500 mg (02/06/2018 2328)  . potassium chloride 10 mEq (02/24/18 1216)   PRN Meds:.ipratropium-albuterol, LORazepam  Antibiotics  :   Anti-infectives (From admission, onward)   Start     Dose/Rate Route Frequency Ordered Stop   02/11/2018 1930  azithromycin (ZITHROMAX) 500 mg in sodium chloride 0.9 % 250 mL IVPB     500  mg 250 mL/hr over 60 Minutes Intravenous Every 24 hours 01/26/2018 1838          Objective:   Vitals:   02/21/2018 2057 02/24/18 0500 02/24/18 0859 02/24/18 1218  BP:   114/64 111/70  Pulse:   74 85  Resp:   18 17  Temp: 97.7 F (36.5 C) (!) 97.4 F (36.3 C) 97.7 F (36.5 C) (!) 97.4 F (36.3 C)  TempSrc:   Oral Oral  SpO2:   94% 92%  Weight: 39.6 kg       Wt Readings from Last 3 Encounters:  01/25/2018 39.6 kg  05/28/15 45.4 kg  11/19/14 45.8 kg     Intake/Output Summary (Last 24 hours) at 02/24/2018 1257 Last data filed at 02/24/2018 0519 Gross per 24 hour  Intake 1120.75 ml  Output 400 ml  Net 720.75 ml     Physical Exam  Awake Alert, Oriented X 3, No new F.N deficits, Normal affect [place].AT,PERRAL Supple Neck,No JVD, No cervical lymphadenopathy appriciated.  Symmetrical Chest wall movement, Good air movement bilaterally, CTAB RRR,No Gallops,Rubs or new Murmurs, No Parasternal Heave +ve B.Sounds, Abd Soft, No tenderness, No organomegaly appriciated, No rebound - guarding or rigidity. No Cyanosis, Clubbing or edema, No new Rash or bruise      Data Review:    CBC Recent Labs  Lab 02/09/2018 1321 02/09/2018 1820 02/24/18 0609  WBC 15.7*  --  11.4*  HGB 9.5*  --  7.7*  HCT 30.6*  --  24.1*  PLT 62*  --  47*  MCV 96.2  --  94.9  MCH 29.9  --  30.3  MCHC 31.0  --  32.0  RDW 15.7*  --  16.1*  LYMPHSABS  --  0.3* 0.2*  MONOABS  --  0.7 0.7  EOSABS  --  0.0 0.0  BASOSABS  --  0.0 0.0    Chemistries  Recent Labs  Lab 01/29/2018 1321 01/30/2018 1448 02/24/18 0609  NA 143  --  146*  K 2.7*  --  2.6*  CL 98  --  105  CO2 25  --  26  GLUCOSE 160*  --  101*  BUN 115*  --  95*  CREATININE 1.96*  --  1.56*  CALCIUM 9.0  --  7.7*  MG  --  2.4  --   AST  --  160* 152*  ALT  --  122* 106*  ALKPHOS  --  183* 158*  BILITOT  --  1.6* 1.6*   ------------------------------------------------------------------------------------------------------------------ No  results for input(s): CHOL, HDL, LDLCALC, TRIG, CHOLHDL, LDLDIRECT in the last 72 hours.  No results found for: HGBA1C ------------------------------------------------------------------------------------------------------------------ No results for input(s): TSH, T4TOTAL, T3FREE, THYROIDAB in the last 72 hours.  Invalid input(s): FREET3 ------------------------------------------------------------------------------------------------------------------ Recent Labs    02/10/2018 2026  FERRITIN 1,484*  TIBC 237*  IRON 86    Coagulation profile Recent Labs  Lab 02/15/2018 1448  INR 1.18    No results for input(s): DDIMER in the last 72 hours.  Cardiac Enzymes No results for input(s): CKMB, TROPONINI, MYOGLOBIN in the last 168 hours.  Invalid input(s): CK ------------------------------------------------------------------------------------------------------------------    Component Value Date/Time   BNP 231.7 (H) 02/15/2018 1321    Micro Results Recent Results (from the past 240 hour(s))  Respiratory Panel by PCR     Status: None   Collection Time: 02/22/2018  6:47 PM  Result Value Ref Range Status   Adenovirus NOT DETECTED NOT DETECTED Final   Coronavirus 229E NOT DETECTED NOT DETECTED Final   Coronavirus HKU1 NOT DETECTED NOT DETECTED Final   Coronavirus NL63 NOT DETECTED NOT DETECTED Final   Coronavirus OC43 NOT DETECTED NOT DETECTED Final   Metapneumovirus NOT DETECTED NOT DETECTED Final   Rhinovirus / Enterovirus NOT DETECTED NOT DETECTED Final   Influenza A NOT DETECTED NOT DETECTED Final   Influenza B NOT DETECTED NOT DETECTED Final   Parainfluenza Virus 1 NOT DETECTED NOT DETECTED Final   Parainfluenza Virus 2 NOT DETECTED NOT DETECTED Final   Parainfluenza Virus 3 NOT DETECTED NOT DETECTED Final   Parainfluenza Virus 4 NOT DETECTED NOT DETECTED Final   Respiratory Syncytial Virus NOT DETECTED NOT DETECTED Final   Bordetella pertussis NOT DETECTED NOT DETECTED Final    Chlamydophila pneumoniae NOT DETECTED NOT DETECTED Final   Mycoplasma pneumoniae NOT DETECTED NOT DETECTED Final    Comment: Performed at [facility], [place] [address], [place], [place] [address]    Radiology Reports Dg Chest 2 View  Result Date: 02/15/2018 CLINICAL DATA:  Generalized weakness with dizziness and shortness of breath 1 week. Smoker. EXAM: CHEST - 2 VIEW COMPARISON:  07/13/2011 FINDINGS: Lungs are somewhat hyperexpanded without focal airspace consolidation or effusion. Cardiomediastinal silhouette is within normal. There is prominence of the right hilum with somewhat lobulated density over the infrahilar region on the lateral film as findings may be due to adenopathy/mass. Remainder of the exam is unchanged. IMPRESSION: No acute cardiopulmonary disease. Increased prominence of the right hilum with possible adenopathy/mass in the infrahilar region on the lateral film. Recommend contrast-enhanced chest CT for further evaluation. Electronically Signed   By: Marin Olp M.D.   On: 02/21/2018 14:09   Ct Chest Wo Contrast  Result Date: 02/14/2018 CLINICAL DATA:  Generalized weakness which shortness-of-breath one week. Acute renal insufficiency. EXAM: CT CHEST WITHOUT CONTRAST TECHNIQUE: Multidetector CT imaging of  the chest was performed following the standard protocol without IV contrast. COMPARISON:  CT abdomen/pelvis 05/03/2009 FINDINGS: Cardiovascular: Heart is normal size. Calcified plaque over the coronary arteries. Calcified plaque over the thoracic aorta. Remaining vascular structures are unremarkable. Mediastinum/Nodes: It is difficult to assess mediastinal and hilar adenopathy due to lack of intravenous contrast. There is moderate mediastinal adenopathy. There is a 2 cm precarinal lymph node and 1.6 cm AP window lymph node. 1.6 cm and 1.7 cm subcarinal lymph nodes. Likely mild right hilar adenopathy. Remaining mediastinal structures are unremarkable. Lungs/Pleura: Lungs are  adequately inflated demonstrate moderate diffuse emphysematous disease. There is mild biapical pleural thickening. There are several small scattered bilateral subcentimeter calcified granulomas. No focal airspace process or effusion. There is a nodule over the right infrahilar region measuring 2 x 2.8 cm with moderate nodularity central over the airways of the central right lower lobe. The largest masslike area of nodularity measures 2.7 x 3.3 cm. Upper Abdomen: Mild hepatomegaly with somewhat nodular liver contour. 1.6 cm cyst over the upper pole left kidney. Calcified plaque over the abdominal aorta. Musculoskeletal: Degenerative change of the spine IMPRESSION: Moderate focal nodularity over the right lower lobe centered over the airways with the largest area of nodularity measuring 2.7 x 3.3 cm. Central right lower lobe nodule abutting the inferior pulmonary vein measuring 2 x 2.8 cm. There is moderate mediastinal and likely right hilar adenopathy. These findings may be due to central right lower lobe bronchogenic neoplasm. Recommend thoracic surgery consultation. Moderate emphysematous disease and evidence of prior granulomas disease. Aortic Atherosclerosis (ICD10-I70.0). Atherosclerotic coronary artery disease. 1.6 cm left renal cyst. Mild hepatomegaly. Electronically Signed   By: Marin Olp M.D.   On: 02/03/2018 18:29   US Abdomen Complete  Result Date: 01/30/2018 CLINICAL DATA:  Transaminitis.  Thrombocytopenia. EXAM: ABDOMEN ULTRASOUND COMPLETE COMPARISON:  CT scan February 23, 2018 FINDINGS: Gallbladder: No gallstones or wall thickening visualized. No sonographic Murphy sign noted by sonographer. Common bile duct: Diameter: 2.9 mm Liver: The liver is heterogeneous with multiple apparent masses. A representative mass in the right hepatic lobe measures 5.5 x 4.9 x 5.6 cm. The liver is nodular in contour on today's CT of the chest raising the possibility of cirrhosis. Portal vein is patent on color  Doppler imaging with normal direction of blood flow towards the liver. IVC: Poorly visualized but grossly unremarkable. Pancreas: Poorly visualized but grossly unremarkable. Spleen: Size and appearance within normal limits. Right Kidney: Length: 8.2 cm. Increased cortical echogenicity. Contains 2 cysts with the largest measuring 1.7 cm. No hydronephrosis. Left Kidney: Length: 8.4 cm. Contains 2 cysts with the largest measuring 3.2 cm. Increased cortical echogenicity. Abdominal aorta: Atherosclerotic change in the nonaneurysmal aorta. Other findings: None. IMPRESSION: 1. Apparent hepatic masses. Heterogeneous echotexture in the liver. Nodular contour on the CT scan suggesting cirrhosis, not as well appreciated on this study. Recommend an MRI as an outpatient for further evaluation of the liver and to evaluate the apparent underlying masses. 2. Small echogenic kidneys with cysts suggesting medical renal disease. 3. Atherosclerotic changes in the nonaneurysmal aorta. Electronically Signed   By: Dorise Bullion III M.D   On: 01/27/2018 20:13    Time Spent in minutes  30   Lala Lund M.D on 02/24/2018 at 12:57 PM  To page go to [email or web address] - password [facility]

## 2018-02-24 NOTE — Progress Notes (Signed)
SLP Cancellation Note  Patient Details Name: KIRANDEEP FARISS MRN: [id number] DOB: 1932/01/22   Cancelled treatment:       Reason Eval/Treat Not Completed: SLP screened, no needs identified, will sign off. Family reports pt had difficulty swallowing pills while mouth was dry. Pt now back to baseline with swallowing, enjoying meals, no difficulty with pills today. Will defer eval.    Isaish Alemu, Katherene Ponto 02/24/2018, 3:02 PM

## 2018-02-24 NOTE — Progress Notes (Signed)
PT Cancellation Note  Patient Details Name: Jenny Sexton MRN: [id number] DOB: 28-Aug-1931   Cancelled Treatment:    Reason Eval/Treat Not Completed: Patient at procedure or test/unavailable -- will reattempt as able in next 24 hours.  Thank you.   Kearney Hard [facility] 02/24/2018, 1:53 PM

## 2018-02-24 DEATH — deceased

## 2018-02-25 DIAGNOSIS — D696 Thrombocytopenia, unspecified: Secondary | ICD-10-CM

## 2018-02-25 DIAGNOSIS — R740 Nonspecific elevation of levels of transaminase and lactic acid dehydrogenase [LDH]: Secondary | ICD-10-CM

## 2018-02-25 LAB — CBC
HCT: 25.6 % — ABNORMAL LOW (ref 36.0–46.0)
Hemoglobin: 8.1 g/dL — ABNORMAL LOW (ref 12.0–15.0)
MCH: 30.3 pg (ref 26.0–34.0)
MCHC: 31.6 g/dL (ref 30.0–36.0)
MCV: 95.9 fL (ref 80.0–100.0)
NRBC: 1.3 % — AB (ref 0.0–0.2)
Platelets: 68 10*3/uL — ABNORMAL LOW (ref 150–400)
RBC: 2.67 MIL/uL — ABNORMAL LOW (ref 3.87–5.11)
RDW: 17.2 % — ABNORMAL HIGH (ref 11.5–15.5)
WBC: 12.8 10*3/uL — ABNORMAL HIGH (ref 4.0–10.5)

## 2018-02-25 LAB — HEPATITIS PANEL, ACUTE
HCV Ab: <0.1 s/co ratio (ref 0.0–0.9)
Hep A IgM: NEGATIVE
Hep B C IgM: NEGATIVE
Hepatitis B Surface Ag: NEGATIVE

## 2018-02-25 LAB — COMPREHENSIVE METABOLIC PANEL
ALBUMIN: 2.4 g/dL — AB (ref 3.5–5.0)
ALK PHOS: 182 U/L — AB (ref 38–126)
ALT: 117 U/L — ABNORMAL HIGH (ref 0–44)
AST: 179 U/L — ABNORMAL HIGH (ref 15–41)
Anion gap: 12 (ref 5–15)
BUN: 74 mg/dL — ABNORMAL HIGH (ref 8–23)
CO2: 24 mmol/L (ref 22–32)
Calcium: 8.1 mg/dL — ABNORMAL LOW (ref 8.9–10.3)
Chloride: 111 mmol/L (ref 98–111)
Creatinine, Ser: 1.52 mg/dL — ABNORMAL HIGH (ref 0.44–1.00)
GFR calc Af Amer: 36 mL/min — ABNORMAL LOW (ref >60)
GFR calc non Af Amer: 31 mL/min — ABNORMAL LOW (ref >60)
Glucose, Bld: 109 mg/dL — ABNORMAL HIGH (ref 70–99)
Potassium: 3.9 mmol/L (ref 3.5–5.1)
Sodium: 147 mmol/L — ABNORMAL HIGH (ref 135–145)
Total Bilirubin: 1.8 mg/dL — ABNORMAL HIGH (ref 0.3–1.2)
Total Protein: 4.4 g/dL — ABNORMAL LOW (ref 6.5–8.1)

## 2018-02-25 LAB — MAGNESIUM: Magnesium: 2 mg/dL (ref 1.7–2.4)

## 2018-02-25 MED ORDER — DEXTROSE-NACL 5-0.45 % IV SOLN
INTRAVENOUS | Status: DC
  Administered 2018-02-25 – 2018-02-26 (×2): via INTRAVENOUS

## 2018-02-25 NOTE — Evaluation (Signed)
Physical Therapy Evaluation Patient Details Name: Jenny Sexton MRN: [id number] DOB: 1931-12-25 Today's Date: 02/25/2018   History of Present Illness  Patient is an 84 y/o female presenting to the ED on 02/22/2018 with primary complaints of worsening SOB and generalized weakness. CT - Moderate focal nodularity over the right lower lobe centered over the airways with the largest area of nodularity measuring 2.7 x 3.3 cm. Central right lower lobe nodule abutting the inferior pulmonary vein measuring 2 x 2.8 cm; possible liver mass. PMH significant for essential hypertension, hyperlipidemia, 60+ current tobacco user, history of colonic polyps and family history of colon cancer.    Clinical Impression  Jenny Sexton is a very pleasant 84 y/o female admitted with the above listed diagnosis. Patient and family report functional independence with mobility prior to admission. Patient today limited by SOB and increased HR - nursing notified, with overall Min A required for safety and stability. Patient and family plan to have 24 hr supervision at discharge, however if this is unable patient likely to need SNF due to current functional status and reduced mobility. PT to follow acutely.     Follow Up Recommendations Home health PT;Supervision/Assistance - 24 hour(HH Aide - patient and family wishing to take pt home)    Equipment Recommendations  3in1 (PT)    Recommendations for Other Services       Precautions / Restrictions Precautions Precautions: Fall Restrictions Weight Bearing Restrictions: No      Mobility  Bed Mobility Overal bed mobility: Needs Assistance Bed Mobility: Supine to Sit;Sit to Supine     Supine to sit: Min guard Sit to supine: Min assist   General bed mobility comments: Min A for LE management into bed  Transfers Overall transfer level: Needs assistance Equipment used: Rolling walker (2 wheeled) Transfers: Sit to/from Stand Sit to Stand: Min assist          General transfer comment: Min A to power up at bedside  Ambulation/Gait             General Gait Details: gait deferred due to patient reporitng SOB - SpO2 at 93% on RA, HR up to 123 with sit to stand and 5 side steps at bedside  Stairs            Wheelchair Mobility    Modified Rankin (Stroke Patients Only)       Balance Overall balance assessment: Needs assistance Sitting-balance support: No upper extremity supported;Feet supported Sitting balance-Leahy Scale: Fair     Standing balance support: Bilateral upper extremity supported Standing balance-Leahy Scale: Poor                               Pertinent Vitals/Pain Pain Assessment: No/denies pain    Home Living Family/patient expects to be discharged to:: Private residence Living Arrangements: Alone Available Help at Discharge: Family;Available 24 hours/day Type of Home: House Home Access: Stairs to enter Entrance Stairs-Rails: Right Entrance Stairs-Number of Steps: 4 Home Layout: Two level Home Equipment: Walker - 2 wheels      Prior Function Level of Independence: Independent with assistive device(s)         Comments: use of RW for mobility     Hand Dominance   Dominant Hand: Right    Extremity/Trunk Assessment   Upper Extremity Assessment Upper Extremity Assessment: Defer to OT evaluation    Lower Extremity Assessment Lower Extremity Assessment: Generalized weakness    Cervical / Trunk  Assessment Cervical / Trunk Assessment: Kyphotic  Communication   Communication: No difficulties  Cognition Arousal/Alertness: Awake/alert Behavior During Therapy: WFL for tasks assessed/performed Overall Cognitive Status: Within Functional Limits for tasks assessed                                        General Comments General comments (skin integrity, edema, etc.): family present and supportive    Exercises     Assessment/Plan    PT Assessment Patient needs  continued PT services  PT Problem List Decreased strength;Decreased activity tolerance;Decreased balance;Decreased mobility;Decreased knowledge of use of DME;Decreased safety awareness       PT Treatment Interventions DME instruction;Gait training;Functional mobility training;Therapeutic activities;Therapeutic exercise;Balance training;Patient/family education    PT Goals (Current goals can be found in the Care Plan section)  Acute Rehab PT Goals Patient Stated Goal: regain mobility/strength PT Goal Formulation: With patient Time For Goal Achievement: 03/11/18 Potential to Achieve Goals: Fair    Frequency Min 3X/week   Barriers to discharge        Co-evaluation               AM-PAC PT "6 Clicks" Mobility  Outcome Measure Help needed turning from your back to your side while in a flat bed without using bedrails?: A Little Help needed moving from lying on your back to sitting on the side of a flat bed without using bedrails?: A Little Help needed moving to and from a bed to a chair (including a wheelchair)?: A Little Help needed standing up from a chair using your arms (e.g., wheelchair or bedside chair)?: A Little Help needed to walk in hospital room?: A Lot Help needed climbing 3-5 steps with a railing? : Total 6 Click Score: 15    End of Session Equipment Utilized During Treatment: Gait belt Activity Tolerance: Patient limited by fatigue Patient left: in bed;with call bell/phone within reach;with family/visitor present Nurse Communication: Mobility status PT Visit Diagnosis: Unsteadiness on feet (R26.81);Other abnormalities of gait and mobility (R26.89);Muscle weakness (generalized) (M62.81)    Time: 7209-4709 PT Time Calculation (min) (ACUTE ONLY): 32 min   Charges:   PT Evaluation $PT Eval Moderate Complexity: 1 Mod PT Treatments $Therapeutic Activity: 8-22 mins       Lanney Gins, PT, DPT Supplemental Physical Therapist 02/25/18 1:40 PM Pager:  [phone] Office: [phone]

## 2018-02-25 NOTE — Consult Note (Signed)
Chief Complaint: Patient was seen in consultation today for liver biopsy  Referring Physician(s): Dr. Lala Lund  Supervising Physician: Corrie Mckusick  Patient Status: [facility] - In-pt  History of Present Illness: Jenny Sexton is a 84 y.o. female with a past medical history significant for HTN, HLD and COPD with current tobacco use who presented to [facility] ED on 02/12/2018 with complaints of worsening dyspnea, weakness, unintentional weight loss, melena and intermittent dizziness. She was found to have (+) FOBT in the ED with hgb 9.5 (baseline 12) and was subsequently admitted for possible upper GI bleed. Initial CXR showed increased prominence of the right hilum with possible adenopathy/mass in the infrahilar region; follow up CT chest w/o contrast showed moderate focal nodularity over the right lower lobe centered over the airways with the largest area of nodularity measuring 2.7 x 3.3 cm; central right lower lobe nodule abutting the inferior pulmonary vein measuring 2 x 2.8 cm; there is moderate mediastinal and likely right hilar adenopathy. Additionally, US abdomen performed due to transaminitis which showed apparent hepatic masses and follow-up MR liver confirms likely widespread metastatic liver disease. Request has been made to IR for biopsy of these liver masses.  Patient laying in bed with daughter and PT at bedside, getting ready to do PT exercises. She states that she feels ok, has been having poor appetite and some trouble breathing when she lays flat. She is aware of the possibility of a liver biopsy.     Past Medical History:  Diagnosis Date  . Cardiomegaly    Stable  . Chest pain    Occasional non cardiac  . COPD (chronic obstructive pulmonary disease) ([place])    With ongoing smoking  . Glaucoma   . Hypercholesterolemia   . Hypertension    ESSENTIAL  . Osteoporosis   . Pelvic relaxation    with pessary  . Psoriasis   . Tobacco abuse     Past Surgical History:    Procedure Laterality Date  . OOPHORECTOMY Left   . TONSILLECTOMY      Allergies: Amoxicillin and Codeine  Medications: Prior to Admission medications   Medication Sig Start Date End Date Taking? Authorizing Provider  atenolol (TENORMIN) 50 MG tablet Take 1 tablet (50 mg total) by mouth daily. 06/21/11  Yes Darlin Coco, MD  brimonidine (ALPHAGAN P) 0.1 % SOLN 1 drop both eyes twice a day    Yes [provider]  cetirizine (ZYRTEC) 10 MG tablet Take 10 mg by mouth daily.   Yes [provider]  dorzolamide-timolol (COSOPT) 22.3-6.8 MG/ML ophthalmic solution 1 drop 2 (two) times daily.     Yes [provider]  Multiple Vitamins-Minerals (MULTIVITAMIN ADULT PO) Take by mouth.   Yes [provider]  Travoprost, BAK Free, (TRAVATAMN) 0.004 % SOLN ophthalmic solution 1 drop at bedtime.     Yes [provider]  triamterene-hydrochlorothiazide (DYAZIDE) 37.5-25 MG per capsule Take 1 each (1 capsule total) by mouth daily. 1 tab daily 06/21/11  Yes Darlin Coco, MD  VENTOLIN HFA 108 (90 Base) MCG/ACT inhaler Inhale 2 puffs into the lungs every 6 (six) hours as needed. 02/11/18  Yes [provider]  atorvastatin (LIPITOR) 10 MG tablet Take 1 tablet (10 mg total) by mouth daily. Patient not taking: Reported on 02/20/2018 04/27/11   Darlin Coco, MD     Family History  Problem Relation Age of Onset  . Heart attack Father   . Cancer Mother     Social History  Socioeconomic History  . Marital status: Widowed    Spouse name: Not on file  . Number of children: Not on file  . Years of education: Not on file  . Highest education level: Not on file  Occupational History  . Not on file  Social Needs  . Financial resource strain: Not on file  . Food insecurity:    Worry: Not on file    Inability: Not on file  . Transportation needs:    Medical: Not on file    Non-medical: Not on file  Tobacco Use  . Smoking status: Smoker,  Current Status Unknown    Packs/day: 1.00  . Smokeless tobacco: Never Used  Substance and Sexual Activity  . Alcohol use: Never    Frequency: Never  . Drug use: Never  . Sexual activity: Not on file  Lifestyle  . Physical activity:    Days per week: Not on file    Minutes per session: Not on file  . Stress: Not on file  Relationships  . Social connections:    Talks on phone: Not on file    Gets together: Not on file    Attends religious service: Not on file    Active member of club or organization: Not on file    Attends meetings of clubs or organizations: Not on file    Relationship status: Not on file  Other Topics Concern  . Not on file  Social History Narrative  . Not on file     Review of Systems: A 12 point ROS discussed and pertinent positives are indicated in the HPI above.  All other systems are negative.  Review of Systems  Constitutional: Positive for appetite change (decreased) and unexpected weight change. Negative for chills and fever.  Respiratory: Positive for shortness of breath (intermittent; with exertion and laying flat). Negative for cough.   Cardiovascular: Negative for chest pain.  Gastrointestinal: Negative for abdominal pain, diarrhea, nausea and vomiting.  Skin: Negative for rash.  Neurological: Negative for dizziness and syncope.  Psychiatric/Behavioral: Negative for confusion.    Vital Signs: BP 126/70   Pulse 85   Temp (!) 97.3 F (36.3 C) (Oral)   Resp (!) 23   Wt 87 lb 4.8 oz (39.6 kg)   SpO2 93%   BMI 15.97 kg/m   Physical Exam  Constitutional: She is oriented to person, place, and time. No distress.  Frail appearing elderly female laying in bed; comfortably speaking in full sentences. Daughter at bedside during exam.  HENT:  Head: Normocephalic.  Cardiovascular: Normal rate, regular rhythm and normal heart sounds.  Pulmonary/Chest: Effort normal. She has wheezes (bilaterally).  Abdominal: Soft. Bowel sounds are normal. She  exhibits no distension. There is no tenderness.  Neurological: She is alert and oriented to person, place, and time.  Skin: She is not diaphoretic.  Psychiatric: She has a normal mood and affect. Her behavior is normal. Judgment and thought content normal.  Nursing note and vitals reviewed.   MD Evaluation Airway: WNL(partial top denture (not in place)) Heart: WNL Abdomen: WNL Chest/ Lungs: WNL ASA  Classification: 3 Mallampati/Airway Score: Two   Imaging: Dg Chest 2 View  Result Date: 02/05/2018 CLINICAL DATA:  Generalized weakness with dizziness and shortness of breath 1 week. Smoker. EXAM: CHEST - 2 VIEW COMPARISON:  07/13/2011 FINDINGS: Lungs are somewhat hyperexpanded without focal airspace consolidation or effusion. Cardiomediastinal silhouette is within normal. There is prominence of the right hilum with somewhat lobulated density over the infrahilar region  on the lateral film as findings may be due to adenopathy/mass. Remainder of the exam is unchanged. IMPRESSION: No acute cardiopulmonary disease. Increased prominence of the right hilum with possible adenopathy/mass in the infrahilar region on the lateral film. Recommend contrast-enhanced chest CT for further evaluation. Electronically Signed   By: Marin Olp M.D.   On: 02/03/2018 14:09   Ct Chest Wo Contrast  Result Date: 02/04/2018 CLINICAL DATA:  Generalized weakness which shortness-of-breath one week. Acute renal insufficiency. EXAM: CT CHEST WITHOUT CONTRAST TECHNIQUE: Multidetector CT imaging of the chest was performed following the standard protocol without IV contrast. COMPARISON:  CT abdomen/pelvis 05/03/2009 FINDINGS: Cardiovascular: Heart is normal size. Calcified plaque over the coronary arteries. Calcified plaque over the thoracic aorta. Remaining vascular structures are unremarkable. Mediastinum/Nodes: It is difficult to assess mediastinal and hilar adenopathy due to lack of intravenous contrast. There is moderate  mediastinal adenopathy. There is a 2 cm precarinal lymph node and 1.6 cm AP window lymph node. 1.6 cm and 1.7 cm subcarinal lymph nodes. Likely mild right hilar adenopathy. Remaining mediastinal structures are unremarkable. Lungs/Pleura: Lungs are adequately inflated demonstrate moderate diffuse emphysematous disease. There is mild biapical pleural thickening. There are several small scattered bilateral subcentimeter calcified granulomas. No focal airspace process or effusion. There is a nodule over the right infrahilar region measuring 2 x 2.8 cm with moderate nodularity central over the airways of the central right lower lobe. The largest masslike area of nodularity measures 2.7 x 3.3 cm. Upper Abdomen: Mild hepatomegaly with somewhat nodular liver contour. 1.6 cm cyst over the upper pole left kidney. Calcified plaque over the abdominal aorta. Musculoskeletal: Degenerative change of the spine IMPRESSION: Moderate focal nodularity over the right lower lobe centered over the airways with the largest area of nodularity measuring 2.7 x 3.3 cm. Central right lower lobe nodule abutting the inferior pulmonary vein measuring 2 x 2.8 cm. There is moderate mediastinal and likely right hilar adenopathy. These findings may be due to central right lower lobe bronchogenic neoplasm. Recommend thoracic surgery consultation. Moderate emphysematous disease and evidence of prior granulomas disease. Aortic Atherosclerosis (ICD10-I70.0). Atherosclerotic coronary artery disease. 1.6 cm left renal cyst. Mild hepatomegaly. Electronically Signed   By: Marin Olp M.D.   On: 02/14/2018 18:29   US Abdomen Complete  Result Date: 02/04/2018 CLINICAL DATA:  Transaminitis.  Thrombocytopenia. EXAM: ABDOMEN ULTRASOUND COMPLETE COMPARISON:  CT scan February 23, 2018 FINDINGS: Gallbladder: No gallstones or wall thickening visualized. No sonographic Murphy sign noted by sonographer. Common bile duct: Diameter: 2.9 mm Liver: The liver is  heterogeneous with multiple apparent masses. A representative mass in the right hepatic lobe measures 5.5 x 4.9 x 5.6 cm. The liver is nodular in contour on today's CT of the chest raising the possibility of cirrhosis. Portal vein is patent on color Doppler imaging with normal direction of blood flow towards the liver. IVC: Poorly visualized but grossly unremarkable. Pancreas: Poorly visualized but grossly unremarkable. Spleen: Size and appearance within normal limits. Right Kidney: Length: 8.2 cm. Increased cortical echogenicity. Contains 2 cysts with the largest measuring 1.7 cm. No hydronephrosis. Left Kidney: Length: 8.4 cm. Contains 2 cysts with the largest measuring 3.2 cm. Increased cortical echogenicity. Abdominal aorta: Atherosclerotic change in the nonaneurysmal aorta. Other findings: None. IMPRESSION: 1. Apparent hepatic masses. Heterogeneous echotexture in the liver. Nodular contour on the CT scan suggesting cirrhosis, not as well appreciated on this study. Recommend an MRI as an outpatient for further evaluation of the liver and to evaluate the apparent  underlying masses. 2. Small echogenic kidneys with cysts suggesting medical renal disease. 3. Atherosclerotic changes in the nonaneurysmal aorta. Electronically Signed   By: Dorise Bullion III M.D   On: 02/20/2018 20:13   Mr Liver W Wo Contrast  Result Date: 02/24/2018 CLINICAL DATA:  Liver masses on ultrasound from 1 day prior. Right lower lobe lung mass and mediastinal adenopathy on chest CT from 1 day prior. EXAM: MRI ABDOMEN WITHOUT AND WITH CONTRAST TECHNIQUE: Multiplanar multisequence MR imaging of the abdomen was performed both before and after the administration of intravenous contrast. CONTRAST:  4 cc Gadavist IV. COMPARISON:  01/26/2018 abdominal sonogram and chest CT. 05/03/2009 CT abdomen/pelvis. FINDINGS: Limited motion degraded scan with limited IV contrast enhancement. Lower chest: Right lower lobe 3.2 cm irregular lung mass is again  demonstrated (series 15/image 80). Right hilar/infrahilar and subcarinal and paratracheal mediastinal adenopathy is partially visualized on this scan, better seen on chest CT from 1 day prior. Hepatobiliary: Liver is enlarged. No hepatic steatosis. There are innumerable similar confluent hypoenhancing liver masses replacing much of the liver parenchyma, all new since 05/03/2009 CT abdomen study, each demonstrating mild T2 hyperintensity and T1 hypointensity, compatible with widespread liver metastatic disease. Representative liver masses as follows: -segment 4B left liver lobe 8.0 x 5.3 cm mass (series 5/image 25) -segment 7 right liver lobe 5.0 x 4.2 cm mass (series 5/image 11) -segment 2 left liver lobe 4.5 x 3.4 cm mass (series 5/image 9) Normal gallbladder with no cholelithiasis. There are scattered regions of mild peripheral intrahepatic biliary ductal dilatation. Normal caliber common bile duct with diameter 4 mm. No choledocholithiasis. Pancreas: No pancreatic mass or duct dilation.  No pancreas divisum. Spleen: Normal size. No mass. Adrenals/Urinary Tract: Normal adrenals. No hydronephrosis. Several simple renal cysts in both kidneys, largest an exophytic simple 2.9 cm lateral lower left renal cyst. Subcentimeter T1 hyperintense T2 hypointense renal cortical lesion in the posterior lower right kidney without appreciable enhancement, compatible with a tiny hemorrhagic/proteinaceous Bosniak category 2 renal cyst. Stomach/Bowel: Normal non-distended stomach. Visualized small and large bowel is normal caliber, with no bowel wall thickening. Vascular/Lymphatic: Nonaneurysmal abdominal aorta. Unable to assess venous patency given limited IV contrast enhancement. No pathologically enlarged lymph nodes in the abdomen. Other: Small volume right perihepatic/right pericolic gutter ascites. No focal fluid collection. Musculoskeletal: Widespread patchy osseous lesions throughout the visualized thoracolumbar spine and  bilateral pelvic skeleton. IMPRESSION: 1. Irregular 3.2 cm right lower lobe lung mass, for which primary bronchogenic carcinoma is the diagnosis of exclusion. Partially visualized right hilar/infrahilar and subcarinal/paratracheal mediastinal adenopathy suspicious for nodal metastases. These findings were better depicted on the chest CT study from 01/28/2018. 2. Widespread liver metastases, enlarging the liver and replacing much of the liver parenchyma. 3. Scattered regions of peripheral intrahepatic biliary ductal dilatation throughout the liver with normal caliber CBD. 4. Widespread patchy osseous lesions throughout the visualized axial skeleton most compatible with widespread osseous metastases. 5. Small volume right perihepatic/right pericolic gutter ascites. Electronically Signed   By: Ilona Sorrel M.D.   On: 02/24/2018 16:25    Labs:  CBC: Recent Labs    02/15/2018 1321 02/24/18 0609 02/25/18 0258  WBC 15.7* 11.4* 12.8*  HGB 9.5* 7.7* 8.1*  HCT 30.6* 24.1* 25.6*  PLT 62* 47* 68*    COAGS: Recent Labs    02/16/2018 1448 02/24/18 1452  INR 1.18 1.37    BMP: Recent Labs    02/22/2018 1321 02/24/18 0609 02/25/18 0258  NA 143 146* 147*  K 2.7* 2.6*  3.9  CL 98 105 111  CO2 25 26 24   GLUCOSE 160* 101* 109*  BUN 115* 95* 74*  CALCIUM 9.0 7.7* 8.1*  CREATININE 1.96* 1.56* 1.52*  GFRNONAA 23* 30* 31*  GFRAA 27* 35* 36*    LIVER FUNCTION TESTS: Recent Labs    02/03/2018 1448 02/24/18 0609 02/25/18 0258  BILITOT 1.6* 1.6* 1.8*  AST 160* 152* 179*  ALT 122* 106* 117*  ALKPHOS 183* 158* 182*  PROT 5.4* 4.0* 4.4*  ALBUMIN 3.0* 2.3* 2.4*    TUMOR MARKERS: No results for input(s): AFPTM, CEA, CA199, CHROMGRNA in the last 8760 hours.  Assessment and Plan:  84 y/o F with history of COPD, current tobacco use, HTN, HLD who was admitted for symptomatic anemia. CT chest 11/30 showed nodularity over RLL, US abdomen 11/30 showed likely hepatic masses with follow-up MR liver showing  irregular RLL lung mass concerning for primary bronchogenic carcinoma as well as widespread liver metastases. Request has been made to IR for biopsy of the liver mass. Patient reviewed with Dr. Earleen Newport who agrees to liver biopsy - tentatively planned for 12/3.  Patient is afebrile, will be NPO after midnight tonight, she does not take blood thinning medications at home and is not on blood thinners while inpatient due to anemia. WBC 12.8, hgb 8.1, INR 1.37.   Risks and benefits discussed with the patient including, but not limited to bleeding, infection, damage to adjacent structures or low yield requiring additional tests.  All of the patient's questions were answered, patient is agreeable to proceed.  Consent signed and in chart.  Thank you for this interesting consult.  I greatly enjoyed meeting Jenny Sexton and look forward to participating in their care.  A copy of this report was sent to the requesting provider on this date.  Electronically Signed: Joaquim Nam, PA-C 02/25/2018, 10:35 AM   I spent a total of 40 Minutes in face to face in clinical consultation, greater than 50% of which was counseling/coordinating care for liver biopsy.

## 2018-02-25 NOTE — Progress Notes (Signed)
PROGRESS NOTE                                                                                                                                                                                                             Patient Demographics:    Jenny Sexton, is a 84 y.o. female, DOB - 1931/07/02, OAC:[id number]  Admit date - 02/22/2018   Admitting Physician Kayleen Memos, DO  Outpatient Primary MD for the patient is Shon Baton, MD  LOS - 2  Chief Complaint  Patient presents with  . Shortness of Breath  . Weakness       Brief Narrative  Jenny Sexton is a 84 y.o. female with past medical history significant for essential hypertension, hyperlipidemia, 60+ current tobacco user, history of colonic polyps and family history of colon cancer in her mother who presented to [facility] ED accompanied by her granddaughter with complaints of worsening shortness of breath and generalized weakness of 2 weeks duration.  Associated with dark stools and intermittent dizziness, patient reports she recently had a colonoscopy done at [facility] and it was unremarkable, also unintentional weight loss 10 to 20 pounds. 60+ current smoker. workup showed -heme positive stool, leukocytosis, anemia, ARF, CT showed - central right lower lobe bronchogenic neoplasm.   Subjective:   Patient in bed, appears comfortable, denies any headache, no fever, no chest pain or pressure, no shortness of breath , no abdominal pain. No focal weakness.   Assessment  & Plan :     1.  Right lower lobe lung mass which possible liver metastasis.  Discussed with patient and daughter, she would like to get a diagnostic biopsy, I have requested IR to conduct a CT-guided liver biopsy as it is easily accessible, will follow pathology and thereafter discussed line of care with patient and daughter.  Likely most of the treatment will be palliative in nature.  2.  Smoking and COPD.  Stable supportive care.  Counseled to quit  smoking.  3.  Severe hypokalemia.  Replaced and stable.  4.  Anemia with dizziness and weakness.  Some fall with dilution, anemia panel unrevealing, will monitor and trend.  5.  Weakly heme positive stool.  Placed on oral PPI and monitor.  Outpatient GI follow-up.  6. ARF - improved after IVF, monitor.  7.  Elevated LFTs.  Due to extensive liver mets.       Family Communication  :  None  Code Status :  DNR  Disposition Plan  :  TBD  Consults  :  GI  Procedures  :    MRI Liver -    1. Irregular 3.2 cm right lower lobe lung mass, for which primary bronchogenic carcinoma is the diagnosis of exclusion. Partially visualized right hilar/infrahilar and subcarinal/paratracheal mediastinal adenopathy suspicious for nodal metastases. These findings were better depicted on the chest CT study from 02/17/2018. 2. Widespread liver metastases, enlarging the liver and replacing much of the liver parenchyma. 3. Scattered regions of peripheral intrahepatic biliary ductal dilatation throughout the liver with normal caliber CBD. 4. Widespread patchy osseous lesions throughout the visualized axial skeleton most compatible with widespread osseous metastases. 5. Small volume right perihepatic/right pericolic gutter ascites.  CT - Moderate focal nodularity over the right lower lobe centered over the airways with the largest area of nodularity measuring 2.7 x 3.3 cm. Central right lower lobe nodule abutting the inferior pulmonary vein measuring 2 x 2.8 cm. There is moderate mediastinal and likely right hilar adenopathy. These findings may be due to central right lower lobe bronchogenic neoplasm. Recommend thoracic surgery consultation. Moderate emphysematous disease and evidence of prior granulomas disease. Aortic Atherosclerosis (ICD10-I70.0). Atherosclerotic coronary artery disease. 1.6 cm left renal cyst. Mild hepatomegaly.   DVT Prophylaxis  :    SCDs    Lab Results  Component Value Date   PLT 68  (L) 02/25/2018    Diet :  Diet Order            DIET SOFT Room service appropriate? Yes; Fluid consistency: Thin  Diet effective now               Inpatient Medications Scheduled Meds: . dextromethorphan-guaiFENesin  2 tablet Oral BID  . nicotine  21 mg Transdermal Daily  . pantoprazole  40 mg Oral Daily   Continuous Infusions: . azithromycin 500 mg (02/24/18 2147)   PRN Meds:.ipratropium-albuterol, LORazepam  Antibiotics  :   Anti-infectives (From admission, onward)   Start     Dose/Rate Route Frequency Ordered Stop   01/29/2018 1930  azithromycin (ZITHROMAX) 500 mg in sodium chloride 0.9 % 250 mL IVPB     500 mg 250 mL/hr over 60 Minutes Intravenous Every 24 hours 02/01/2018 1838          Objective:   Vitals:   02/24/18 1917 02/25/18 0300 02/25/18 0800 02/25/18 0818  BP: 121/72  126/70   Pulse:   85   Resp:   (!) 23   Temp:  97.6 F (36.4 C)  (!) 97.3 F (36.3 C)  TempSrc:    Oral  SpO2:   93%   Weight:        Wt Readings from Last 3 Encounters:  01/29/2018 39.6 kg  05/28/15 45.4 kg  11/19/14 45.8 kg     Intake/Output Summary (Last 24 hours) at 02/25/2018 0949 Last data filed at 02/25/2018 0405 Gross per 24 hour  Intake 250 ml  Output 1150 ml  Net -900 ml     Physical Exam  Awake Alert, Oriented X 3, No new F.N deficits, Normal affect Jenny Sexton.AT,PERRAL Supple Neck,No JVD, No cervical lymphadenopathy appriciated.  Symmetrical Chest wall movement, Good air movement bilaterally, CTAB RRR,No Gallops, Rubs or new Murmurs, No Parasternal Heave +ve B.Sounds, Abd Soft, No tenderness, No organomegaly appriciated, No rebound - guarding or rigidity. No Cyanosis, Clubbing or edema, No new Rash or bruise      Data Review:    CBC Recent Labs  Lab 01/27/2018 1321 02/21/2018 1820 02/24/18 0609 02/25/18 0258  WBC 15.7*  --  11.4* 12.8*  HGB 9.5*  --  7.7* 8.1*  HCT 30.6*  --  24.1* 25.6*  PLT 62*  --  47* 68*  MCV 96.2  --  94.9 95.9  MCH 29.9  --  30.3 30.3   MCHC 31.0  --  32.0 31.6  RDW 15.7*  --  16.1* 17.2*  LYMPHSABS  --  0.3* 0.2*  --   MONOABS  --  0.7 0.7  --   EOSABS  --  0.0 0.0  --   BASOSABS  --  0.0 0.0  --     Chemistries  Recent Labs  Lab 02/15/2018 1321 02/20/2018 1448 02/24/18 0609 02/25/18 0258  NA 143  --  146* 147*  K 2.7*  --  2.6* 3.9  CL 98  --  105 111  CO2 25  --  26 24  GLUCOSE 160*  --  101* 109*  BUN 115*  --  95* 74*  CREATININE 1.96*  --  1.56* 1.52*  CALCIUM 9.0  --  7.7* 8.1*  MG  --  2.4  --  2.0  AST  --  160* 152* 179*  ALT  --  122* 106* 117*  ALKPHOS  --  183* 158* 182*  BILITOT  --  1.6* 1.6* 1.8*   ------------------------------------------------------------------------------------------------------------------ No results for input(s): CHOL, HDL, LDLCALC, TRIG, CHOLHDL, LDLDIRECT in the last 72 hours.  No results found for: HGBA1C ------------------------------------------------------------------------------------------------------------------ No results for input(s): TSH, T4TOTAL, T3FREE, THYROIDAB in the last 72 hours.  Invalid input(s): FREET3 ------------------------------------------------------------------------------------------------------------------ Recent Labs    02/12/2018 2026  FERRITIN 1,484*  TIBC 237*  IRON 86    Coagulation profile Recent Labs  Lab 02/21/2018 1448 02/24/18 1452  INR 1.18 1.37    No results for input(s): DDIMER in the last 72 hours.  Cardiac Enzymes No results for input(s): CKMB, TROPONINI, MYOGLOBIN in the last 168 hours.  Invalid input(s): CK ------------------------------------------------------------------------------------------------------------------    Component Value Date/Time   BNP 231.7 (H) 02/21/2018 1321    Micro Results Recent Results (from the past 240 hour(s))  Respiratory Panel by PCR     Status: None   Collection Time: 02/05/2018  6:47 PM  Result Value Ref Range Status   Adenovirus NOT DETECTED NOT DETECTED Final    Coronavirus 229E NOT DETECTED NOT DETECTED Final   Coronavirus HKU1 NOT DETECTED NOT DETECTED Final   Coronavirus NL63 NOT DETECTED NOT DETECTED Final   Coronavirus OC43 NOT DETECTED NOT DETECTED Final   Metapneumovirus NOT DETECTED NOT DETECTED Final   Rhinovirus / Enterovirus NOT DETECTED NOT DETECTED Final   Influenza A NOT DETECTED NOT DETECTED Final   Influenza B NOT DETECTED NOT DETECTED Final   Parainfluenza Virus 1 NOT DETECTED NOT DETECTED Final   Parainfluenza Virus 2 NOT DETECTED NOT DETECTED Final   Parainfluenza Virus 3 NOT DETECTED NOT DETECTED Final   Parainfluenza Virus 4 NOT DETECTED NOT DETECTED Final   Respiratory Syncytial Virus NOT DETECTED NOT DETECTED Final   Bordetella pertussis NOT DETECTED NOT DETECTED Final   Chlamydophila pneumoniae NOT DETECTED NOT DETECTED Final   Mycoplasma pneumoniae NOT DETECTED NOT DETECTED Final    Comment: Performed at [facility], [place] [address], [place], [place] [address]    Radiology Reports Dg Chest 2 View  Result Date: 01/28/2018 CLINICAL DATA:  Generalized weakness with dizziness and shortness of breath 1 week. Smoker. EXAM: CHEST - 2 VIEW COMPARISON:  07/13/2011 FINDINGS: Lungs are somewhat hyperexpanded without focal airspace consolidation  or effusion. Cardiomediastinal silhouette is within normal. There is prominence of the right hilum with somewhat lobulated density over the infrahilar region on the lateral film as findings may be due to adenopathy/mass. Remainder of the exam is unchanged. IMPRESSION: No acute cardiopulmonary disease. Increased prominence of the right hilum with possible adenopathy/mass in the infrahilar region on the lateral film. Recommend contrast-enhanced chest CT for further evaluation. Electronically Signed   By: Marin Olp M.D.   On: 02/13/2018 14:09   Ct Chest Wo Contrast  Result Date: 02/10/2018 CLINICAL DATA:  Generalized weakness which shortness-of-breath one week. Acute renal  insufficiency. EXAM: CT CHEST WITHOUT CONTRAST TECHNIQUE: Multidetector CT imaging of the chest was performed following the standard protocol without IV contrast. COMPARISON:  CT abdomen/pelvis 05/03/2009 FINDINGS: Cardiovascular: Heart is normal size. Calcified plaque over the coronary arteries. Calcified plaque over the thoracic aorta. Remaining vascular structures are unremarkable. Mediastinum/Nodes: It is difficult to assess mediastinal and hilar adenopathy due to lack of intravenous contrast. There is moderate mediastinal adenopathy. There is a 2 cm precarinal lymph node and 1.6 cm AP window lymph node. 1.6 cm and 1.7 cm subcarinal lymph nodes. Likely mild right hilar adenopathy. Remaining mediastinal structures are unremarkable. Lungs/Pleura: Lungs are adequately inflated demonstrate moderate diffuse emphysematous disease. There is mild biapical pleural thickening. There are several small scattered bilateral subcentimeter calcified granulomas. No focal airspace process or effusion. There is a nodule over the right infrahilar region measuring 2 x 2.8 cm with moderate nodularity central over the airways of the central right lower lobe. The largest masslike area of nodularity measures 2.7 x 3.3 cm. Upper Abdomen: Mild hepatomegaly with somewhat nodular liver contour. 1.6 cm cyst over the upper pole left kidney. Calcified plaque over the abdominal aorta. Musculoskeletal: Degenerative change of the spine IMPRESSION: Moderate focal nodularity over the right lower lobe centered over the airways with the largest area of nodularity measuring 2.7 x 3.3 cm. Central right lower lobe nodule abutting the inferior pulmonary vein measuring 2 x 2.8 cm. There is moderate mediastinal and likely right hilar adenopathy. These findings may be due to central right lower lobe bronchogenic neoplasm. Recommend thoracic surgery consultation. Moderate emphysematous disease and evidence of prior granulomas disease. Aortic Atherosclerosis  (ICD10-I70.0). Atherosclerotic coronary artery disease. 1.6 cm left renal cyst. Mild hepatomegaly. Electronically Signed   By: Marin Olp M.D.   On: 02/02/2018 18:29   US Abdomen Complete  Result Date: 01/30/2018 CLINICAL DATA:  Transaminitis.  Thrombocytopenia. EXAM: ABDOMEN ULTRASOUND COMPLETE COMPARISON:  CT scan February 23, 2018 FINDINGS: Gallbladder: No gallstones or wall thickening visualized. No sonographic Murphy sign noted by sonographer. Common bile duct: Diameter: 2.9 mm Liver: The liver is heterogeneous with multiple apparent masses. A representative mass in the right hepatic lobe measures 5.5 x 4.9 x 5.6 cm. The liver is nodular in contour on today's CT of the chest raising the possibility of cirrhosis. Portal vein is patent on color Doppler imaging with normal direction of blood flow towards the liver. IVC: Poorly visualized but grossly unremarkable. Pancreas: Poorly visualized but grossly unremarkable. Spleen: Size and appearance within normal limits. Right Kidney: Length: 8.2 cm. Increased cortical echogenicity. Contains 2 cysts with the largest measuring 1.7 cm. No hydronephrosis. Left Kidney: Length: 8.4 cm. Contains 2 cysts with the largest measuring 3.2 cm. Increased cortical echogenicity. Abdominal aorta: Atherosclerotic change in the nonaneurysmal aorta. Other findings: None. IMPRESSION: 1. Apparent hepatic masses. Heterogeneous echotexture in the liver. Nodular contour on the CT scan suggesting cirrhosis, not as  well appreciated on this study. Recommend an MRI as an outpatient for further evaluation of the liver and to evaluate the apparent underlying masses. 2. Small echogenic kidneys with cysts suggesting medical renal disease. 3. Atherosclerotic changes in the nonaneurysmal aorta. Electronically Signed   By: Dorise Bullion III M.D   On: 02/11/2018 20:13   Mr Liver W Wo Contrast  Result Date: 02/24/2018 CLINICAL DATA:  Liver masses on ultrasound from 1 day prior. Right lower  lobe lung mass and mediastinal adenopathy on chest CT from 1 day prior. EXAM: MRI ABDOMEN WITHOUT AND WITH CONTRAST TECHNIQUE: Multiplanar multisequence MR imaging of the abdomen was performed both before and after the administration of intravenous contrast. CONTRAST:  4 cc Gadavist IV. COMPARISON:  02/12/2018 abdominal sonogram and chest CT. 05/03/2009 CT abdomen/pelvis. FINDINGS: Limited motion degraded scan with limited IV contrast enhancement. Lower chest: Right lower lobe 3.2 cm irregular lung mass is again demonstrated (series 15/image 80). Right hilar/infrahilar and subcarinal and paratracheal mediastinal adenopathy is partially visualized on this scan, better seen on chest CT from 1 day prior. Hepatobiliary: Liver is enlarged. No hepatic steatosis. There are innumerable similar confluent hypoenhancing liver masses replacing much of the liver parenchyma, all new since 05/03/2009 CT abdomen study, each demonstrating mild T2 hyperintensity and T1 hypointensity, compatible with widespread liver metastatic disease. Representative liver masses as follows: -segment 4B left liver lobe 8.0 x 5.3 cm mass (series 5/image 25) -segment 7 right liver lobe 5.0 x 4.2 cm mass (series 5/image 11) -segment 2 left liver lobe 4.5 x 3.4 cm mass (series 5/image 9) Normal gallbladder with no cholelithiasis. There are scattered regions of mild peripheral intrahepatic biliary ductal dilatation. Normal caliber common bile duct with diameter 4 mm. No choledocholithiasis. Pancreas: No pancreatic mass or duct dilation.  No pancreas divisum. Spleen: Normal size. No mass. Adrenals/Urinary Tract: Normal adrenals. No hydronephrosis. Several simple renal cysts in both kidneys, largest an exophytic simple 2.9 cm lateral lower left renal cyst. Subcentimeter T1 hyperintense T2 hypointense renal cortical lesion in the posterior lower right kidney without appreciable enhancement, compatible with a tiny hemorrhagic/proteinaceous Bosniak category 2  renal cyst. Stomach/Bowel: Normal non-distended stomach. Visualized small and large bowel is normal caliber, with no bowel wall thickening. Vascular/Lymphatic: Nonaneurysmal abdominal aorta. Unable to assess venous patency given limited IV contrast enhancement. No pathologically enlarged lymph nodes in the abdomen. Other: Small volume right perihepatic/right pericolic gutter ascites. No focal fluid collection. Musculoskeletal: Widespread patchy osseous lesions throughout the visualized thoracolumbar spine and bilateral pelvic skeleton. IMPRESSION: 1. Irregular 3.2 cm right lower lobe lung mass, for which primary bronchogenic carcinoma is the diagnosis of exclusion. Partially visualized right hilar/infrahilar and subcarinal/paratracheal mediastinal adenopathy suspicious for nodal metastases. These findings were better depicted on the chest CT study from 02/05/2018. 2. Widespread liver metastases, enlarging the liver and replacing much of the liver parenchyma. 3. Scattered regions of peripheral intrahepatic biliary ductal dilatation throughout the liver with normal caliber CBD. 4. Widespread patchy osseous lesions throughout the visualized axial skeleton most compatible with widespread osseous metastases. 5. Small volume right perihepatic/right pericolic gutter ascites. Electronically Signed   By: Ilona Sorrel M.D.   On: 02/24/2018 16:25    Time Spent in minutes  30   Lala Lund M.D on 02/25/2018 at 9:49 AM  To page go to [email or web address] - password [facility]

## 2018-02-25 NOTE — Progress Notes (Signed)
   02/25/18 1600  Clinical Encounter Type  Visited With Patient and family together  Visit Type Initial  Referral From Other (Comment)  Consult/Referral To None  Spiritual Encounters  Spiritual Needs Emotional  Stress Factors  Patient Stress Factors None identified  Family Stress Factors Health changes   While rounding I met granddaughter in hallway and she was concerned for PT's health. PT was alert and sister was at bedside. Sister said that pastor visited earlier today. They were both thankful for the visit. I offered spiritual care with words of encouragement, a listening ear, and ministry of presence. Chaplain available as needed.   Chaplain Fidel Levy [phone]

## 2018-02-26 ENCOUNTER — Inpatient Hospital Stay (HOSPITAL_COMMUNITY): Payer: Medicare Other

## 2018-02-26 LAB — CBC
HCT: 25.7 % — ABNORMAL LOW (ref 36.0–46.0)
Hemoglobin: 8.1 g/dL — ABNORMAL LOW (ref 12.0–15.0)
MCH: 30.5 pg (ref 26.0–34.0)
MCHC: 31.5 g/dL (ref 30.0–36.0)
MCV: 96.6 fL (ref 80.0–100.0)
Platelets: 64 10*3/uL — ABNORMAL LOW (ref 150–400)
RBC: 2.66 MIL/uL — ABNORMAL LOW (ref 3.87–5.11)
RDW: 17.4 % — ABNORMAL HIGH (ref 11.5–15.5)
WBC: 12.5 10*3/uL — ABNORMAL HIGH (ref 4.0–10.5)
nRBC: 0.6 % — ABNORMAL HIGH (ref 0.0–0.2)

## 2018-02-26 LAB — COMPREHENSIVE METABOLIC PANEL
ALT: 122 U/L — ABNORMAL HIGH (ref 0–44)
AST: 153 U/L — ABNORMAL HIGH (ref 15–41)
Albumin: 2.3 g/dL — ABNORMAL LOW (ref 3.5–5.0)
Alkaline Phosphatase: 204 U/L — ABNORMAL HIGH (ref 38–126)
Anion gap: 9 (ref 5–15)
BUN: 59 mg/dL — ABNORMAL HIGH (ref 8–23)
CO2: 24 mmol/L (ref 22–32)
Calcium: 8 mg/dL — ABNORMAL LOW (ref 8.9–10.3)
Chloride: 112 mmol/L — ABNORMAL HIGH (ref 98–111)
Creatinine, Ser: 1.28 mg/dL — ABNORMAL HIGH (ref 0.44–1.00)
GFR calc Af Amer: 44 mL/min — ABNORMAL LOW (ref >60)
GFR calc non Af Amer: 38 mL/min — ABNORMAL LOW (ref >60)
GLUCOSE: 198 mg/dL — AB (ref 70–99)
Potassium: 3.3 mmol/L — ABNORMAL LOW (ref 3.5–5.1)
Sodium: 145 mmol/L (ref 135–145)
Total Bilirubin: 1.7 mg/dL — ABNORMAL HIGH (ref 0.3–1.2)
Total Protein: 4.3 g/dL — ABNORMAL LOW (ref 6.5–8.1)

## 2018-02-26 MED ORDER — FENTANYL CITRATE (PF) 100 MCG/2ML IJ SOLN
INTRAMUSCULAR | Status: AC
  Filled 2018-02-26: qty 2

## 2018-02-26 MED ORDER — MIDAZOLAM HCL 2 MG/2ML IJ SOLN
INTRAMUSCULAR | Status: AC | PRN
  Administered 2018-02-26: 0.5 mg via INTRAVENOUS

## 2018-02-26 MED ORDER — FENTANYL CITRATE (PF) 100 MCG/2ML IJ SOLN
INTRAMUSCULAR | Status: AC | PRN
  Administered 2018-02-26: 12.5 ug via INTRAVENOUS

## 2018-02-26 MED ORDER — LIDOCAINE HCL (PF) 1 % IJ SOLN
INTRAMUSCULAR | Status: AC
  Filled 2018-02-26: qty 30

## 2018-02-26 MED ORDER — MIDAZOLAM HCL 2 MG/2ML IJ SOLN
INTRAMUSCULAR | Status: AC
  Filled 2018-02-26: qty 2

## 2018-02-26 MED ORDER — GELATIN ABSORBABLE 12-7 MM EX MISC
CUTANEOUS | Status: AC
  Filled 2018-02-26: qty 1

## 2018-02-26 NOTE — NC FL2 (Signed)
[place] MEDICAID FL2 LEVEL OF CARE SCREENING TOOL     IDENTIFICATION  Patient Name: Jenny Sexton Birthdate: 09-May-1931 Sex: female Admission Date (Current Location): 02/07/2018  [facility] and [place] Number:  Herbalist and Address:  [facility], [place] [address] [place], [place] [address]      Provider Number: [id number]  Attending Physician Name and Address:  Thurnell Lose, MD  Relative Name and Phone Number:  Joseph Art, daughter, [phone]    Current Level of Care: Hospital Recommended Level of Care: [place] Prior Approval Number:    Date Approved/Denied:   PASRR Number: [id number]  Discharge Plan: SNF    Current Diagnoses: Patient Active Problem List   Diagnosis Date Noted  . Right lower lobe lung mass 02/24/2018  . Symptomatic anemia 02/18/2018  . Melena   . Shortness of breath   . Hypercholesterolemia 12/27/2010  . Benign hypertensive heart disease without heart failure 12/27/2010  . Psoriasis 12/27/2010  . Tobacco abuse 12/27/2010    Orientation RESPIRATION BLADDER Height & Weight     Self, Situation, Place  Normal Continent, External catheter Weight: 39.6 kg Height:     BEHAVIORAL SYMPTOMS/MOOD NEUROLOGICAL BOWEL NUTRITION STATUS      Continent Diet(Please see DC Summary)  AMBULATORY STATUS COMMUNICATION OF NEEDS Skin   Limited Assist Verbally Surgical wounds(Closed incision on abdomen)                       Personal Care Assistance Level of Assistance  Bathing, Feeding, Dressing Bathing Assistance: Limited assistance Feeding assistance: Limited assistance Dressing Assistance: Limited assistance     Functional Limitations Info  Sight, Hearing, Speech Sight Info: Adequate Hearing Info: Adequate Speech Info: Adequate    SPECIAL CARE FACTORS FREQUENCY  PT (By licensed PT), OT (By licensed OT)     PT Frequency: 5x/week OT Frequency: 3x/week            Contractures  Contractures Info: Not present    Additional Factors Info  Code Status, Allergies Code Status Info: DNR Allergies Info: Allergies:  Amoxicillin, Codeine           Current Medications (02/26/2018):  This is the current hospital active medication list Current Facility-Administered Medications  Medication Dose Route Frequency Provider Last Rate Last Dose  . dextromethorphan-guaiFENesin (MUCINEX DM) 30-600 MG per 12 hr tablet 2 tablet  2 tablet Oral BID Irene Pap N, DO   2 tablet at 02/26/18 0950  . fentaNYL (SUBLIMAZE) 100 MCG/2ML injection           . gelatin adsorbable (GELFOAM/SURGIFOAM) 12-7 MM sponge 12-7 mm           . ipratropium-albuterol (DUONEB) 0.5-2.5 (3) MG/3ML nebulizer solution 3 mL  3 mL Nebulization Q6H PRN Hall, Carole N, DO      . lidocaine (PF) (XYLOCAINE) 1 % injection           . LORazepam (ATIVAN) injection 1 mg  1 mg Intravenous Once PRN Thurnell Lose, MD      . midazolam (VERSED) 2 MG/2ML injection           . nicotine (NICODERM CQ - dosed in mg/24 hours) patch 21 mg  21 mg Transdermal Daily Irene Pap N, DO   21 mg at 02/26/18 0951  . pantoprazole (PROTONIX) EC tablet 40 mg  40 mg Oral Daily Thurnell Lose, MD   40 mg at 02/26/18 [phone]  Discharge Medications: Please see discharge summary for a list of discharge medications.  Relevant Imaging Results:  Relevant Lab Results:   Additional Information [place], [place]

## 2018-02-26 NOTE — Clinical Social Work Note (Signed)
Clinical Social Work Assessment  Patient Details  Name: Jenny Sexton MRN: [id number] Date of Birth: [birth year]  Date of referral:  02/26/18               Reason for consult:  Facility Placement                Permission sought to share information with:  Facility Sport and exercise psychologist, Family Supports Permission granted to share information::  Yes, Verbal Permission Granted  Name::     Fish farm manager::  SNFs  Relationship::  Daughter  Contact Information:  [phone]  Housing/Transportation Living arrangements for the past 2 months:  [place] of Information:  Adult Children Patient Interpreter Needed:  None Criminal Activity/Legal Involvement Pertinent to Current Situation/Hospitalization:  No - Comment as needed Significant Relationships:  Adult Children Lives with:  Self Do you feel safe going back to the place where you live?  No Need for family participation in patient care:  Yes (Comment)  Care giving concerns:  CSW received consult for possible SNF placement at time of discharge. CSW spoke with patient's daughter and granddaughter regarding PT recommendation of SNF placement at time of discharge. Patient's daugther reported that patient lives alone and is currently unable to care for herself at home given patient's current physical needs and fall risk. Patient's daughter expressed understanding of PT recommendation and is agreeable to SNF placement at time of discharge. CSW to continue to follow and assist with discharge planning needs.   Social Worker assessment / plan:  CSW spoke with patient's duaghter concerning possibility of rehab at [facility] before returning home.  Employment status:  Retired Forensic scientist:  Medicare PT Recommendations:  [place] / Referral to community resources:  [place]  Patient/Family's Response to care:  Patient's daughter recognizes need for rehab before returning home and is  agreeable to a SNF in [place]. Patient reported preference for [facility] since she has made a down payment on one of their apartments if she ever chooses to move there. [facility] is arranging a bed for her.   Patient/Family's Understanding of and Emotional Response to Diagnosis, Current Treatment, and Prognosis:  Patient/family is realistic regarding therapy needs and expressed being hopeful for SNF placement. Patient expressed understanding of CSW role and discharge process as well as medical condition. No questions/concerns about plan or treatment.    Emotional Assessment Appearance:  Appears stated age Attitude/Demeanor/Rapport:  Unable to Assess Affect (typically observed):  Unable to Assess Orientation:  Oriented to Self, Oriented to Place, Oriented to Situation Alcohol / Substance use:  Not Applicable Psych involvement (Current and /or in the community):  No (Comment)  Discharge Needs  Concerns to be addressed:  Care Coordination Readmission within the last 30 days:  No Current discharge risk:  None Barriers to Discharge:  Continued Medical Work up   [employer], LCSW 02/26/2018, 5:22 PM

## 2018-02-26 NOTE — Progress Notes (Signed)
PROGRESS NOTE                                                                                                                                                                                                             Patient Demographics:    Jenny Sexton, is a 84 y.o. female, DOB - [birth year], HQI:[id number]  Admit date - 02/05/2018   Admitting Physician Kayleen Memos, DO  Outpatient Primary MD for the patient is Shon Baton, MD  LOS - 3  Chief Complaint  Patient presents with  . Shortness of Breath  . Weakness       Brief Narrative  Jenny Sexton is a 84 y.o. female with past medical history significant for essential hypertension, hyperlipidemia, 60+ current tobacco user, history of colonic polyps and family history of colon cancer in her mother who presented to [facility] ED accompanied by her granddaughter with complaints of worsening shortness of breath and generalized weakness of 2 weeks duration.  Associated with dark stools and intermittent dizziness, patient reports she recently had a colonoscopy done at [facility] and it was unremarkable, also unintentional weight loss 10 to 20 pounds. 60+ current smoker. workup showed -heme positive stool, leukocytosis, anemia, ARF, CT showed - central right lower lobe bronchogenic neoplasm.    She had extensive liver lesions suspicious for mets, after discussions with patient and daughter patient underwent CT-guided liver biopsy on 02/26/2018.  If stable plan is to discharge on 02/27/2018.   Subjective:   Patient in bed, appears comfortable, denies any headache, no fever, no chest pain or pressure, no shortness of breath , no abdominal pain. No focal weakness.   Assessment  & Plan :     1.  Right lower lobe lung mass which possible liver metastasis.  Discussed with patient and daughter, she would like to get a diagnostic biopsy, case discussed with pulmonary and IR, she underwent CT-guided liver biopsy on 02/26/2018, commence activity and  PT about 4 hours after the biopsy.  Current plan explained clearly to patient and daughter, biopsy results to be back in 47 to 72 hours, if patient is stable after biopsy and after PT evaluation today she will be discharged tomorrow with outpatient follow-up with PCP.  Upon next PCP follow-up they will follow with the biopsy results with the PCP and then discussed the future plan i.e. whether they want to pursue chemoradiation etc. or hospice.  2.  Smoking and COPD.  Stable supportive care.  Counseled to quit smoking.  3.  Severe hypokalemia.  Replaced and stable.  4.  Anemia with dizziness and weakness.  Some fall with dilution, anemia panel unrevealing, H&H tomorrow after biopsy.  5.  Weakly heme positive stool.  Placed on oral PPI and monitor.  Outpatient GI follow-up.  6. ARF - improved after IVF, monitor.  7.  Elevated LFTs.  Due to extensive liver mets stable trend.       Family Communication  :  None  Code Status :  DNR  Disposition Plan  :  TBD  Consults  :  GI  Procedures  :    Liver biopsy on 02/26/2018 by IR Dr. Pascal Lux.    MRI Liver -    1. Irregular 3.2 cm right lower lobe lung mass, for which primary bronchogenic carcinoma is the diagnosis of exclusion. Partially visualized right hilar/infrahilar and subcarinal/paratracheal mediastinal adenopathy suspicious for nodal metastases. These findings were better depicted on the chest CT study from 01/25/2018. 2. Widespread liver metastases, enlarging the liver and replacing much of the liver parenchyma. 3. Scattered regions of peripheral intrahepatic biliary ductal dilatation throughout the liver with normal caliber CBD. 4. Widespread patchy osseous lesions throughout the visualized axial skeleton most compatible with widespread osseous metastases. 5. Small volume right perihepatic/right pericolic gutter ascites.  CT - Moderate focal nodularity over the right lower lobe centered over the airways with the largest area of  nodularity measuring 2.7 x 3.3 cm. Central right lower lobe nodule abutting the inferior pulmonary vein measuring 2 x 2.8 cm. There is moderate mediastinal and likely right hilar adenopathy. These findings may be due to central right lower lobe bronchogenic neoplasm. Recommend thoracic surgery consultation. Moderate emphysematous disease and evidence of prior granulomas disease. Aortic Atherosclerosis (ICD10-I70.0). Atherosclerotic coronary artery disease. 1.6 cm left renal cyst. Mild hepatomegaly.   DVT Prophylaxis  :    SCDs    Lab Results  Component Value Date   PLT 64 (L) 02/26/2018    Diet :  Diet Order            Diet Heart Room service appropriate? Yes; Fluid consistency: Thin  Diet effective now               Inpatient Medications Scheduled Meds: . dextromethorphan-guaiFENesin  2 tablet Oral BID  . fentaNYL      . gelatin adsorbable      . lidocaine (PF)      . midazolam      . nicotine  21 mg Transdermal Daily  . pantoprazole  40 mg Oral Daily   Continuous Infusions:  PRN Meds:.ipratropium-albuterol, LORazepam  Antibiotics  :   Anti-infectives (From admission, onward)   Start     Dose/Rate Route Frequency Ordered Stop   02/06/2018 1930  azithromycin (ZITHROMAX) 500 mg in sodium chloride 0.9 % 250 mL IVPB  Status:  Discontinued     500 mg 250 mL/hr over 60 Minutes Intravenous Every 24 hours 02/22/2018 1838 02/25/18 1032        Objective:   Vitals:   02/26/18 0905 02/26/18 0910 02/26/18 0915 02/26/18 0930  BP: 123/89 121/81 (!) 126/96 133/88  Pulse: 98 93 92 90  Resp: (!) 28 (!) 26 (!) 27 (!) 27  Temp:      TempSrc:      SpO2: 98% 99% 99% 91%  Weight:        Wt Readings from Last 3 Encounters:  01/30/2018 39.6 kg  05/28/15 45.4 kg  11/19/14 45.8 kg  Intake/Output Summary (Last 24 hours) at 02/26/2018 1050 Last data filed at 02/26/2018 0600 Gross per 24 hour  Intake 825 ml  Output -  Net 825 ml     Physical Exam  Awake Alert,  No new F.N  deficits, Normal affect [place].AT,PERRAL Supple Neck,No JVD, No cervical lymphadenopathy appriciated.  Symmetrical Chest wall movement, Good air movement bilaterally, CTAB RRR,No Gallops, Rubs or new Murmurs, No Parasternal Heave +ve B.Sounds, Abd Soft, No tenderness, No organomegaly appriciated, No rebound - guarding or rigidity. No Cyanosis, Clubbing or edema, No new Rash or bruise    Data Review:    CBC Recent Labs  Lab 02/04/2018 1321 01/31/2018 1820 02/24/18 0609 02/25/18 0258 02/26/18 0250  WBC 15.7*  --  11.4* 12.8* 12.5*  HGB 9.5*  --  7.7* 8.1* 8.1*  HCT 30.6*  --  24.1* 25.6* 25.7*  PLT 62*  --  47* 68* 64*  MCV 96.2  --  94.9 95.9 96.6  MCH 29.9  --  30.3 30.3 30.5  MCHC 31.0  --  32.0 31.6 31.5  RDW 15.7*  --  16.1* 17.2* 17.4*  LYMPHSABS  --  0.3* 0.2*  --   --   MONOABS  --  0.7 0.7  --   --   EOSABS  --  0.0 0.0  --   --   BASOSABS  --  0.0 0.0  --   --     Chemistries  Recent Labs  Lab 02/18/2018 1321 01/26/2018 1448 02/24/18 0609 02/25/18 0258 02/26/18 0250  NA 143  --  146* 147* 145  K 2.7*  --  2.6* 3.9 3.3*  CL 98  --  105 111 112*  CO2 25  --  26 24 24   GLUCOSE 160*  --  101* 109* 198*  BUN 115*  --  95* 74* 59*  CREATININE 1.96*  --  1.56* 1.52* 1.28*  CALCIUM 9.0  --  7.7* 8.1* 8.0*  MG  --  2.4  --  2.0  --   AST  --  160* 152* 179* 153*  ALT  --  122* 106* 117* 122*  ALKPHOS  --  183* 158* 182* 204*  BILITOT  --  1.6* 1.6* 1.8* 1.7*   ------------------------------------------------------------------------------------------------------------------ No results for input(s): CHOL, HDL, LDLCALC, TRIG, CHOLHDL, LDLDIRECT in the last 72 hours.  No results found for: HGBA1C ------------------------------------------------------------------------------------------------------------------ No results for input(s): TSH, T4TOTAL, T3FREE, THYROIDAB in the last 72 hours.  Invalid input(s):  FREET3 ------------------------------------------------------------------------------------------------------------------ Recent Labs    02/18/2018 2026  FERRITIN 1,484*  TIBC 237*  IRON 86    Coagulation profile Recent Labs  Lab 01/30/2018 1448 02/24/18 1452  INR 1.18 1.37    No results for input(s): DDIMER in the last 72 hours.  Cardiac Enzymes No results for input(s): CKMB, TROPONINI, MYOGLOBIN in the last 168 hours.  Invalid input(s): CK ------------------------------------------------------------------------------------------------------------------    Component Value Date/Time   BNP 231.7 (H) 02/11/2018 1321    Micro Results Recent Results (from the past 240 hour(s))  Respiratory Panel by PCR     Status: None   Collection Time: 02/02/2018  6:47 PM  Result Value Ref Range Status   Adenovirus NOT DETECTED NOT DETECTED Final   Coronavirus 229E NOT DETECTED NOT DETECTED Final   Coronavirus HKU1 NOT DETECTED NOT DETECTED Final   Coronavirus NL63 NOT DETECTED NOT DETECTED Final   Coronavirus OC43 NOT DETECTED NOT DETECTED Final   Metapneumovirus NOT DETECTED NOT DETECTED Final   Rhinovirus /  Enterovirus NOT DETECTED NOT DETECTED Final   Influenza A NOT DETECTED NOT DETECTED Final   Influenza B NOT DETECTED NOT DETECTED Final   Parainfluenza Virus 1 NOT DETECTED NOT DETECTED Final   Parainfluenza Virus 2 NOT DETECTED NOT DETECTED Final   Parainfluenza Virus 3 NOT DETECTED NOT DETECTED Final   Parainfluenza Virus 4 NOT DETECTED NOT DETECTED Final   Respiratory Syncytial Virus NOT DETECTED NOT DETECTED Final   Bordetella pertussis NOT DETECTED NOT DETECTED Final   Chlamydophila pneumoniae NOT DETECTED NOT DETECTED Final   Mycoplasma pneumoniae NOT DETECTED NOT DETECTED Final    Comment: Performed at [facility], [place] [address] [place], [place] [address]    Radiology Reports Dg Chest 2 View  Result Date: 01/31/2018 CLINICAL DATA:  Generalized weakness with  dizziness and shortness of breath 1 week. Smoker. EXAM: CHEST - 2 VIEW COMPARISON:  07/13/2011 FINDINGS: Lungs are somewhat hyperexpanded without focal airspace consolidation or effusion. Cardiomediastinal silhouette is within normal. There is prominence of the right hilum with somewhat lobulated density over the infrahilar region on the lateral film as findings may be due to adenopathy/mass. Remainder of the exam is unchanged. IMPRESSION: No acute cardiopulmonary disease. Increased prominence of the right hilum with possible adenopathy/mass in the infrahilar region on the lateral film. Recommend contrast-enhanced chest CT for further evaluation. Electronically Signed   By: Marin Olp M.D.   On: 01/29/2018 14:09   Ct Chest Wo Contrast  Result Date: 01/26/2018 CLINICAL DATA:  Generalized weakness which shortness-of-breath one week. Acute renal insufficiency. EXAM: CT CHEST WITHOUT CONTRAST TECHNIQUE: Multidetector CT imaging of the chest was performed following the standard protocol without IV contrast. COMPARISON:  CT abdomen/pelvis 05/03/2009 FINDINGS: Cardiovascular: Heart is normal size. Calcified plaque over the coronary arteries. Calcified plaque over the thoracic aorta. Remaining vascular structures are unremarkable. Mediastinum/Nodes: It is difficult to assess mediastinal and hilar adenopathy due to lack of intravenous contrast. There is moderate mediastinal adenopathy. There is a 2 cm precarinal lymph node and 1.6 cm AP window lymph node. 1.6 cm and 1.7 cm subcarinal lymph nodes. Likely mild right hilar adenopathy. Remaining mediastinal structures are unremarkable. Lungs/Pleura: Lungs are adequately inflated demonstrate moderate diffuse emphysematous disease. There is mild biapical pleural thickening. There are several small scattered bilateral subcentimeter calcified granulomas. No focal airspace process or effusion. There is a nodule over the right infrahilar region measuring 2 x 2.8 cm with  moderate nodularity central over the airways of the central right lower lobe. The largest masslike area of nodularity measures 2.7 x 3.3 cm. Upper Abdomen: Mild hepatomegaly with somewhat nodular liver contour. 1.6 cm cyst over the upper pole left kidney. Calcified plaque over the abdominal aorta. Musculoskeletal: Degenerative change of the spine IMPRESSION: Moderate focal nodularity over the right lower lobe centered over the airways with the largest area of nodularity measuring 2.7 x 3.3 cm. Central right lower lobe nodule abutting the inferior pulmonary vein measuring 2 x 2.8 cm. There is moderate mediastinal and likely right hilar adenopathy. These findings may be due to central right lower lobe bronchogenic neoplasm. Recommend thoracic surgery consultation. Moderate emphysematous disease and evidence of prior granulomas disease. Aortic Atherosclerosis (ICD10-I70.0). Atherosclerotic coronary artery disease. 1.6 cm left renal cyst. Mild hepatomegaly. Electronically Signed   By: Marin Olp M.D.   On: 02/17/2018 18:29   US Abdomen Complete  Result Date: 02/22/2018 CLINICAL DATA:  Transaminitis.  Thrombocytopenia. EXAM: ABDOMEN ULTRASOUND COMPLETE COMPARISON:  CT scan February 23, 2018 FINDINGS: Gallbladder: No  gallstones or wall thickening visualized. No sonographic Murphy sign noted by sonographer. Common bile duct: Diameter: 2.9 mm Liver: The liver is heterogeneous with multiple apparent masses. A representative mass in the right hepatic lobe measures 5.5 x 4.9 x 5.6 cm. The liver is nodular in contour on today's CT of the chest raising the possibility of cirrhosis. Portal vein is patent on color Doppler imaging with normal direction of blood flow towards the liver. IVC: Poorly visualized but grossly unremarkable. Pancreas: Poorly visualized but grossly unremarkable. Spleen: Size and appearance within normal limits. Right Kidney: Length: 8.2 cm. Increased cortical echogenicity. Contains 2 cysts with the  largest measuring 1.7 cm. No hydronephrosis. Left Kidney: Length: 8.4 cm. Contains 2 cysts with the largest measuring 3.2 cm. Increased cortical echogenicity. Abdominal aorta: Atherosclerotic change in the nonaneurysmal aorta. Other findings: None. IMPRESSION: 1. Apparent hepatic masses. Heterogeneous echotexture in the liver. Nodular contour on the CT scan suggesting cirrhosis, not as well appreciated on this study. Recommend an MRI as an outpatient for further evaluation of the liver and to evaluate the apparent underlying masses. 2. Small echogenic kidneys with cysts suggesting medical renal disease. 3. Atherosclerotic changes in the nonaneurysmal aorta. Electronically Signed   By: Dorise Bullion III M.D   On: 01/29/2018 20:13   Mr Liver W Wo Contrast  Result Date: 02/24/2018 CLINICAL DATA:  Liver masses on ultrasound from 1 day prior. Right lower lobe lung mass and mediastinal adenopathy on chest CT from 1 day prior. EXAM: MRI ABDOMEN WITHOUT AND WITH CONTRAST TECHNIQUE: Multiplanar multisequence MR imaging of the abdomen was performed both before and after the administration of intravenous contrast. CONTRAST:  4 cc Gadavist IV. COMPARISON:  02/08/2018 abdominal sonogram and chest CT. 05/03/2009 CT abdomen/pelvis. FINDINGS: Limited motion degraded scan with limited IV contrast enhancement. Lower chest: Right lower lobe 3.2 cm irregular lung mass is again demonstrated (series 15/image 80). Right hilar/infrahilar and subcarinal and paratracheal mediastinal adenopathy is partially visualized on this scan, better seen on chest CT from 1 day prior. Hepatobiliary: Liver is enlarged. No hepatic steatosis. There are innumerable similar confluent hypoenhancing liver masses replacing much of the liver parenchyma, all new since 05/03/2009 CT abdomen study, each demonstrating mild T2 hyperintensity and T1 hypointensity, compatible with widespread liver metastatic disease. Representative liver masses as follows:  -segment 4B left liver lobe 8.0 x 5.3 cm mass (series 5/image 25) -segment 7 right liver lobe 5.0 x 4.2 cm mass (series 5/image 11) -segment 2 left liver lobe 4.5 x 3.4 cm mass (series 5/image 9) Normal gallbladder with no cholelithiasis. There are scattered regions of mild peripheral intrahepatic biliary ductal dilatation. Normal caliber common bile duct with diameter 4 mm. No choledocholithiasis. Pancreas: No pancreatic mass or duct dilation.  No pancreas divisum. Spleen: Normal size. No mass. Adrenals/Urinary Tract: Normal adrenals. No hydronephrosis. Several simple renal cysts in both kidneys, largest an exophytic simple 2.9 cm lateral lower left renal cyst. Subcentimeter T1 hyperintense T2 hypointense renal cortical lesion in the posterior lower right kidney without appreciable enhancement, compatible with a tiny hemorrhagic/proteinaceous Bosniak category 2 renal cyst. Stomach/Bowel: Normal non-distended stomach. Visualized small and large bowel is normal caliber, with no bowel wall thickening. Vascular/Lymphatic: Nonaneurysmal abdominal aorta. Unable to assess venous patency given limited IV contrast enhancement. No pathologically enlarged lymph nodes in the abdomen. Other: Small volume right perihepatic/right pericolic gutter ascites. No focal fluid collection. Musculoskeletal: Widespread patchy osseous lesions throughout the visualized thoracolumbar spine and bilateral pelvic skeleton. IMPRESSION: 1. Irregular 3.2 cm right  lower lobe lung mass, for which primary bronchogenic carcinoma is the diagnosis of exclusion. Partially visualized right hilar/infrahilar and subcarinal/paratracheal mediastinal adenopathy suspicious for nodal metastases. These findings were better depicted on the chest CT study from 02/03/2018. 2. Widespread liver metastases, enlarging the liver and replacing much of the liver parenchyma. 3. Scattered regions of peripheral intrahepatic biliary ductal dilatation throughout the liver with  normal caliber CBD. 4. Widespread patchy osseous lesions throughout the visualized axial skeleton most compatible with widespread osseous metastases. 5. Small volume right perihepatic/right pericolic gutter ascites. Electronically Signed   By: Ilona Sorrel M.D.   On: 02/24/2018 16:25   US Biopsy (liver)  Result Date: 02/26/2018 INDICATION: Concern for metastatic lung cancer. Please perform ultrasound-guided liver lesion biopsy for tissue diagnostic purposes. EXAM: ULTRASOUND GUIDED LIVER LESION BIOPSY COMPARISON:  Chest CT-02/06/2018; abdominal ultrasound-02/16/2018; abdominal MRI-02/24/2018 MEDICATIONS: None ANESTHESIA/SEDATION: Fentanyl 12.5 mcg IV; Versed 0.5 mg IV Total Moderate Sedation time:  13 Minutes. The patient's level of consciousness and vital signs were monitored continuously by radiology nursing throughout the procedure under my direct supervision. COMPLICATIONS: None immediate. PROCEDURE: Informed written consent was obtained from the patient after a discussion of the risks, benefits and alternatives to treatment. The patient understands and consents the procedure. A timeout was performed prior to the initiation of the procedure. Ultrasound scanning was performed of the right upper abdominal quadrant demonstrates multiple mixed echogenic lesions and masses scattered throughout the liver compatible with findings on preceding abdominal ultrasound and MRI. Note is made of a small amount of perihepatic ascites. An approximately 3.4 x 3.0 cm mass within the caudal aspect the right lobe of the liver correlating with the lesion seen on image 12, series 5 preceding abdominal MRI was targeted for biopsy given sonographic window and lesion location with interposed normal hepatic parenchyma. The procedure was planned. The right upper abdominal quadrant was prepped and draped in the usual sterile fashion. The overlying soft tissues were anesthetized with 1% lidocaine with epinephrine. A 17 gauge, 6.8 cm  co-axial needle was advanced into a peripheral aspect of the lesion. This was followed by 5 core biopsies with an 18 gauge core device under direct ultrasound guidance. The coaxial needle tract was embolized with a small amount of Gel-Foam slurry and superficial hemostasis was obtained with manual compression. Post procedural scanning was negative for definitive area of hemorrhage or additional complication. A dressing was placed. The patient tolerated the procedure well without immediate post procedural complication. IMPRESSION: Technically successful ultrasound guided core needle biopsy of indeterminate lesion within the caudal aspect of the right lobe of the liver. Electronically Signed   By: Sandi Mariscal M.D.   On: 02/26/2018 10:13    Time Spent in minutes  30   Lala Lund M.D on 02/26/2018 at 10:50 AM  To page go to [email or web address] - password [email or web address]

## 2018-02-26 NOTE — Procedures (Signed)
Pre Procedure Dx: Liver masses Post Procedural Dx: Same  Technically successful US guided biopsy of indeterminate mass within the caudal aspect of the right lobe of the liver.   EBL: None  No immediate complications.   Ronny Bacon, MD Pager #: [phone]

## 2018-02-26 NOTE — Progress Notes (Signed)
PT progress note  PT speaking with nursing staff regarding patients current status and family member concerns. Due to patients current functional status - limited functional mobility with required physical assist by PT (as noted in initial evaluation), PT feels as SNF is appropriate. Patient to benefit from subacute rehab to progress safe and independent functional mobility prior to return home.    02/26/18 1200   PT - Assessment/Plan  PT Visit Diagnosis Unsteadiness on feet (R26.81);Other abnormalities of gait and mobility (R26.89);Muscle weakness (generalized) (M62.81)  PT Frequency (ACUTE ONLY) Min 3X/week  Follow Up Recommendations SNF;Supervision/Assistance - 24 hour  PT equipment 3in1 (PT)    Lanney Gins, PT, DPT Supplemental Physical Therapist 02/26/18 12:52 PM Pager: [phone] Office: [phone]

## 2018-02-27 DIAGNOSIS — R748 Abnormal levels of other serum enzymes: Secondary | ICD-10-CM

## 2018-02-27 DIAGNOSIS — J449 Chronic obstructive pulmonary disease, unspecified: Secondary | ICD-10-CM

## 2018-02-27 LAB — CBC
HCT: 30.6 % — ABNORMAL LOW (ref 36.0–46.0)
Hemoglobin: 9.5 g/dL — ABNORMAL LOW (ref 12.0–15.0)
MCH: 30.4 pg (ref 26.0–34.0)
MCHC: 31 g/dL (ref 30.0–36.0)
MCV: 98.1 fL (ref 80.0–100.0)
Platelets: 71 10*3/uL — ABNORMAL LOW (ref 150–400)
RBC: 3.12 MIL/uL — ABNORMAL LOW (ref 3.87–5.11)
RDW: 18.1 % — ABNORMAL HIGH (ref 11.5–15.5)
WBC: 15.3 10*3/uL — AB (ref 4.0–10.5)
nRBC: 0.6 % — ABNORMAL HIGH (ref 0.0–0.2)

## 2018-02-27 LAB — COMPREHENSIVE METABOLIC PANEL
ALT: 185 U/L — ABNORMAL HIGH (ref 0–44)
AST: 189 U/L — ABNORMAL HIGH (ref 15–41)
Albumin: 2.5 g/dL — ABNORMAL LOW (ref 3.5–5.0)
Alkaline Phosphatase: 252 U/L — ABNORMAL HIGH (ref 38–126)
Anion gap: 14 (ref 5–15)
BUN: 55 mg/dL — ABNORMAL HIGH (ref 8–23)
CO2: 24 mmol/L (ref 22–32)
Calcium: 8.8 mg/dL — ABNORMAL LOW (ref 8.9–10.3)
Chloride: 112 mmol/L — ABNORMAL HIGH (ref 98–111)
Creatinine, Ser: 1.41 mg/dL — ABNORMAL HIGH (ref 0.44–1.00)
GFR calc Af Amer: 40 mL/min — ABNORMAL LOW (ref >60)
GFR calc non Af Amer: 34 mL/min — ABNORMAL LOW (ref >60)
Glucose, Bld: 125 mg/dL — ABNORMAL HIGH (ref 70–99)
POTASSIUM: 3.9 mmol/L (ref 3.5–5.1)
Sodium: 150 mmol/L — ABNORMAL HIGH (ref 135–145)
Total Bilirubin: 1.8 mg/dL — ABNORMAL HIGH (ref 0.3–1.2)
Total Protein: 4.8 g/dL — ABNORMAL LOW (ref 6.5–8.1)

## 2018-02-27 LAB — BASIC METABOLIC PANEL
ANION GAP: 9 (ref 5–15)
BUN: 61 mg/dL — ABNORMAL HIGH (ref 8–23)
CO2: 24 mmol/L (ref 22–32)
Calcium: 8.8 mg/dL — ABNORMAL LOW (ref 8.9–10.3)
Chloride: 113 mmol/L — ABNORMAL HIGH (ref 98–111)
Creatinine, Ser: 1.62 mg/dL — ABNORMAL HIGH (ref 0.44–1.00)
GFR calc non Af Amer: 29 mL/min — ABNORMAL LOW (ref >60)
GFR, EST AFRICAN AMERICAN: 33 mL/min — AB (ref >60)
Glucose, Bld: 144 mg/dL — ABNORMAL HIGH (ref 70–99)
Potassium: 3.9 mmol/L (ref 3.5–5.1)
Sodium: 146 mmol/L — ABNORMAL HIGH (ref 135–145)

## 2018-02-27 MED ORDER — WHITE PETROLATUM EX OINT: TOPICAL_OINTMENT | CUTANEOUS | Status: DC | PRN

## 2018-02-27 MED ORDER — SODIUM CHLORIDE 0.45 % IV SOLN
INTRAVENOUS | Status: DC
  Administered 2018-02-27: 08:00:00 via INTRAVENOUS

## 2018-02-27 MED ORDER — ENSURE ENLIVE PO LIQD
237.0000 mL | Freq: Two times a day (BID) | ORAL | Status: DC
  Administered 2018-02-28: 237 mL via ORAL

## 2018-02-27 NOTE — Progress Notes (Addendum)
PROGRESS NOTE        PATIENT DETAILS Name: Jenny Sexton Age: 84 y.o. Sex: female Date of Birth: Jul 08, 1931 Admit Date: 02/15/2018 Admitting Physician Kayleen Memos, DO ZOX:[id number], Jenny Reichmann, MD  Brief Narrative: Patient is a 84 y.o. female with history of hypertension, dyslipidemia, extensive history of tobacco use who presented with worsening generalized weakness,shortness of breath, intermittent dark-colored stools, and dizziness-she has had unintentional weight loss of approximately 20 pounds in the past few months-further work-up revealed a right lower lobe mass with mediastinal and hilar lymphadenopathy along with multiple hepatic masses.  Underwent ultrasound-guided liver biopsy on 12/3-results of which are currently pending.  See below for further details  Subjective: Lying comfortably in bed-appears very cachectic-denies any chest pain or shortness of breath.  Claims to have nausea and very poor oral intake over the past few days.  Assessment/Plan: Right lower lobe lung mass with possible liver and osseous metastases: Went ultrasound-guided liver biopsy on 12/3-results still pending-spoke with oncology on call-Dr. Tomasa Blase will send a epic message over to his office-he will then let their navigator note to arrange outpatient follow-up.  Appears very frail and cachectic-overall prognosis is probably poor at this point.  Further management of lung mass/liver mets will be deferred to the outpatient setting-but may be best served by transitioning to hospice care at some point.  Hypernatremia: Secondary to poor oral intake-start half-normal NS-have encouraged oral water intake-recheck electrolytes tomorrow.  Hypokalemia: Repleted recheck periodically  Anemia: Suspect multifactorial-likely secondary to anemia chronic disease-has heme positive stools without any overt GI bleeding.  Per prior notes-patient had a endoscopy done at [facility] recently that was unremarkable.   Anemia panel consistent with anemia of chronic disease.  Thrombocytopenia:-This may be due to bone marrow involvement/osseous metastases from underlying lung cancer.  Since platelet counts are improving-doubt further work-up is required-continue outpatient monitoring for now.  COPD: Appears stable-continue as needed bronchodilators.  Mild acute kidney injury: Creatinine downtrending-suspect may have developed some chronic kidney disease over the past few years as creatinine seems to have plateaued.  Elevated transaminases: Probably related to liver metastases.  DVT Prophylaxis:  SCD's  Code Status: DNR  Family Communication: Granddaughter at bedside  Disposition Plan: Remain inpatient-SNF on discharge-hopefully on 12/5 if electrolytes are better  Antimicrobial agents: Anti-infectives (From admission, onward)   Start     Dose/Rate Route Frequency Ordered Stop   01/28/2018 1930  azithromycin (ZITHROMAX) 500 mg in sodium chloride 0.9 % 250 mL IVPB  Status:  Discontinued     500 mg 250 mL/hr over 60 Minutes Intravenous Every 24 hours 02/13/2018 1838 02/25/18 1032      Procedures: 12/3>> ultrasound-guided liver biopsy  CONSULTS: IR  Time spent: 25-minutes-Greater than 50% of this time was spent in counseling, explanation of diagnosis, planning of further management, and coordination of care.  MEDICATIONS: Scheduled Meds: . dextromethorphan-guaiFENesin  2 tablet Oral BID  . nicotine  21 mg Transdermal Daily  . pantoprazole  40 mg Oral Daily   Continuous Infusions: . sodium chloride 75 mL/hr at 02/27/18 0750   PRN Meds:.ipratropium-albuterol, LORazepam, white petrolatum   PHYSICAL EXAM: Vital signs: Vitals:   02/26/18 1131 02/26/18 2147 02/27/18 0529 02/27/18 0648  BP: 121/77 120/71 136/80   Pulse: 88 (!) 113 (!) 116   Resp:   19   Temp:  98.2 F (36.8 C) 97.9 F (  36.6 C)   TempSrc:   Oral   SpO2: 93% 93% 96%   Weight:    39.7 kg   Filed Weights   02/11/2018  2057 02/27/18 0648  Weight: 39.6 kg 39.7 kg   Body mass index is 16.01 kg/m.   General appearance :Awake, alert, not in any distress. Speech Clear.  Looks cachectic and chronically ill-appearing Eyes:Pink conjunctiva HEENT: Atraumatic and Normocephalic Neck: supple Resp:Good air entry bilaterally, no added sounds  CVS: S1 S2 regular, no murmurs.  GI: Bowel sounds present, Non tender and not distended with no gaurding, rigidity or rebound.No organomegaly Extremities: B/L Lower Ext shows no edema, both legs are warm to touch Neurology: Nonfocal but with generalized weakness Musculoskeletal:No digital cyanosis Skin:No Rash, warm and dry Wounds:N/A  I have personally reviewed following labs and imaging studies  LABORATORY DATA: CBC: Recent Labs  Lab 01/26/2018 1321 02/05/2018 1820 02/24/18 0609 02/25/18 0258 02/26/18 0250 02/27/18 0335  WBC 15.7*  --  11.4* 12.8* 12.5* 15.3*  NEUTROABS  --  13.1* 10.4*  --   --   --   HGB 9.5*  --  7.7* 8.1* 8.1* 9.5*  HCT 30.6*  --  24.1* 25.6* 25.7* 30.6*  MCV 96.2  --  94.9 95.9 96.6 98.1  PLT 62*  --  47* 68* 64* 71*    Basic Metabolic Panel: Recent Labs  Lab 02/08/2018 1321 02/06/2018 1448 02/24/18 0609 02/25/18 0258 02/26/18 0250 02/27/18 0335  NA 143  --  146* 147* 145 150*  K 2.7*  --  2.6* 3.9 3.3* 3.9  CL 98  --  105 111 112* 112*  CO2 25  --  26 24 24 24   GLUCOSE 160*  --  101* 109* 198* 125*  BUN 115*  --  95* 74* 59* 55*  CREATININE 1.96*  --  1.56* 1.52* 1.28* 1.41*  CALCIUM 9.0  --  7.7* 8.1* 8.0* 8.8*  MG  --  2.4  --  2.0  --   --     GFR: CrCl cannot be calculated (Unknown ideal weight.).  Liver Function Tests: Recent Labs  Lab 02/19/2018 1448 02/24/18 0609 02/25/18 0258 02/26/18 0250 02/27/18 0335  AST 160* 152* 179* 153* 189*  ALT 122* 106* 117* 122* 185*  ALKPHOS 183* 158* 182* 204* 252*  BILITOT 1.6* 1.6* 1.8* 1.7* 1.8*  PROT 5.4* 4.0* 4.4* 4.3* 4.8*  ALBUMIN 3.0* 2.3* 2.4* 2.3* 2.5*   No results  for input(s): LIPASE, AMYLASE in the last 168 hours. No results for input(s): AMMONIA in the last 168 hours.  Coagulation Profile: Recent Labs  Lab 02/12/2018 1448 02/24/18 1452  INR 1.18 1.37    Cardiac Enzymes: No results for input(s): CKTOTAL, CKMB, CKMBINDEX, TROPONINI in the last 168 hours.  BNP (last 3 results) No results for input(s): PROBNP in the last 8760 hours.  HbA1C: No results for input(s): HGBA1C in the last 72 hours.  CBG: Recent Labs  Lab 01/29/2018 1407  GLUCAP 130*    Lipid Profile: No results for input(s): CHOL, HDL, LDLCALC, TRIG, CHOLHDL, LDLDIRECT in the last 72 hours.  Thyroid Function Tests: No results for input(s): TSH, T4TOTAL, FREET4, T3FREE, THYROIDAB in the last 72 hours.  Anemia Panel: No results for input(s): VITAMINB12, FOLATE, FERRITIN, TIBC, IRON, RETICCTPCT in the last 72 hours.  Urine analysis:    Component Value Date/Time   COLORURINE YELLOW 02/14/2018 1725   APPEARANCEUR HAZY (A) 02/07/2018 1725   LABSPEC 1.015 01/27/2018 1725   PHURINE 5.0  02/14/2018 [place] 01/29/2018 1725   HGBUR MODERATE (A) 02/15/2018 [place] 01/26/2018 1725   KETONESUR NEGATIVE 01/30/2018 1725   PROTEINUR NEGATIVE 02/04/2018 1725   UROBILINOGEN 0.2 07/01/2009 1214   NITRITE POSITIVE (A) 02/20/2018 1725   LEUKOCYTESUR TRACE (A) 02/14/2018 1725    Sepsis Labs: Lactic Acid, Venous No results found for: LATICACIDVEN  MICROBIOLOGY: Recent Results (from the past 240 hour(s))  Respiratory Panel by PCR     Status: None   Collection Time: 02/20/2018  6:47 PM  Result Value Ref Range Status   Adenovirus NOT DETECTED NOT DETECTED Final   Coronavirus 229E NOT DETECTED NOT DETECTED Final   Coronavirus HKU1 NOT DETECTED NOT DETECTED Final   Coronavirus NL63 NOT DETECTED NOT DETECTED Final   Coronavirus OC43 NOT DETECTED NOT DETECTED Final   Metapneumovirus NOT DETECTED NOT DETECTED Final   Rhinovirus / Enterovirus NOT  DETECTED NOT DETECTED Final   Influenza A NOT DETECTED NOT DETECTED Final   Influenza B NOT DETECTED NOT DETECTED Final   Parainfluenza Virus 1 NOT DETECTED NOT DETECTED Final   Parainfluenza Virus 2 NOT DETECTED NOT DETECTED Final   Parainfluenza Virus 3 NOT DETECTED NOT DETECTED Final   Parainfluenza Virus 4 NOT DETECTED NOT DETECTED Final   Respiratory Syncytial Virus NOT DETECTED NOT DETECTED Final   Bordetella pertussis NOT DETECTED NOT DETECTED Final   Chlamydophila pneumoniae NOT DETECTED NOT DETECTED Final   Mycoplasma pneumoniae NOT DETECTED NOT DETECTED Final    Comment: Performed at [facility], [place] [address], [place], [place] [address]    RADIOLOGY STUDIES/RESULTS: Dg Chest 2 View  Result Date: 02/09/2018 CLINICAL DATA:  Generalized weakness with dizziness and shortness of breath 1 week. Smoker. EXAM: CHEST - 2 VIEW COMPARISON:  07/13/2011 FINDINGS: Lungs are somewhat hyperexpanded without focal airspace consolidation or effusion. Cardiomediastinal silhouette is within normal. There is prominence of the right hilum with somewhat lobulated density over the infrahilar region on the lateral film as findings may be due to adenopathy/mass. Remainder of the exam is unchanged. IMPRESSION: No acute cardiopulmonary disease. Increased prominence of the right hilum with possible adenopathy/mass in the infrahilar region on the lateral film. Recommend contrast-enhanced chest CT for further evaluation. Electronically Signed   By: Marin Olp M.D.   On: 02/22/2018 14:09   Ct Head Wo Contrast  Result Date: 02/26/2018 CLINICAL DATA:  Decreased mental status EXAM: CT HEAD WITHOUT CONTRAST TECHNIQUE: Contiguous axial images were obtained from the base of the skull through the vertex without intravenous contrast. COMPARISON:  None. FINDINGS: Brain: Atrophic changes are noted as well as mild chronic white matter ischemic change. No findings to suggest acute hemorrhage, acute infarction or  space-occupying mass lesion are noted. Vascular: No hyperdense vessel or unexpected calcification. Skull: Normal. Negative for fracture or focal lesion. Sinuses/Orbits: No acute finding. Other: None. IMPRESSION: Chronic atrophic and ischemic changes without acute abnormality. Electronically Signed   By: Inez Catalina M.D.   On: 02/26/2018 15:28   Ct Chest Wo Contrast  Result Date: 02/11/2018 CLINICAL DATA:  Generalized weakness which shortness-of-breath one week. Acute renal insufficiency. EXAM: CT CHEST WITHOUT CONTRAST TECHNIQUE: Multidetector CT imaging of the chest was performed following the standard protocol without IV contrast. COMPARISON:  CT abdomen/pelvis 05/03/2009 FINDINGS: Cardiovascular: Heart is normal size. Calcified plaque over the coronary arteries. Calcified plaque over the thoracic aorta. Remaining vascular structures are unremarkable. Mediastinum/Nodes: It is difficult to assess mediastinal and hilar adenopathy due to  lack of intravenous contrast. There is moderate mediastinal adenopathy. There is a 2 cm precarinal lymph node and 1.6 cm AP window lymph node. 1.6 cm and 1.7 cm subcarinal lymph nodes. Likely mild right hilar adenopathy. Remaining mediastinal structures are unremarkable. Lungs/Pleura: Lungs are adequately inflated demonstrate moderate diffuse emphysematous disease. There is mild biapical pleural thickening. There are several small scattered bilateral subcentimeter calcified granulomas. No focal airspace process or effusion. There is a nodule over the right infrahilar region measuring 2 x 2.8 cm with moderate nodularity central over the airways of the central right lower lobe. The largest masslike area of nodularity measures 2.7 x 3.3 cm. Upper Abdomen: Mild hepatomegaly with somewhat nodular liver contour. 1.6 cm cyst over the upper pole left kidney. Calcified plaque over the abdominal aorta. Musculoskeletal: Degenerative change of the spine IMPRESSION: Moderate focal  nodularity over the right lower lobe centered over the airways with the largest area of nodularity measuring 2.7 x 3.3 cm. Central right lower lobe nodule abutting the inferior pulmonary vein measuring 2 x 2.8 cm. There is moderate mediastinal and likely right hilar adenopathy. These findings may be due to central right lower lobe bronchogenic neoplasm. Recommend thoracic surgery consultation. Moderate emphysematous disease and evidence of prior granulomas disease. Aortic Atherosclerosis (ICD10-I70.0). Atherosclerotic coronary artery disease. 1.6 cm left renal cyst. Mild hepatomegaly. Electronically Signed   By: Marin Olp M.D.   On: 02/11/2018 18:29   US Abdomen Complete  Result Date: 02/21/2018 CLINICAL DATA:  Transaminitis.  Thrombocytopenia. EXAM: ABDOMEN ULTRASOUND COMPLETE COMPARISON:  CT scan February 23, 2018 FINDINGS: Gallbladder: No gallstones or wall thickening visualized. No sonographic Murphy sign noted by sonographer. Common bile duct: Diameter: 2.9 mm Liver: The liver is heterogeneous with multiple apparent masses. A representative mass in the right hepatic lobe measures 5.5 x 4.9 x 5.6 cm. The liver is nodular in contour on today's CT of the chest raising the possibility of cirrhosis. Portal vein is patent on color Doppler imaging with normal direction of blood flow towards the liver. IVC: Poorly visualized but grossly unremarkable. Pancreas: Poorly visualized but grossly unremarkable. Spleen: Size and appearance within normal limits. Right Kidney: Length: 8.2 cm. Increased cortical echogenicity. Contains 2 cysts with the largest measuring 1.7 cm. No hydronephrosis. Left Kidney: Length: 8.4 cm. Contains 2 cysts with the largest measuring 3.2 cm. Increased cortical echogenicity. Abdominal aorta: Atherosclerotic change in the nonaneurysmal aorta. Other findings: None. IMPRESSION: 1. Apparent hepatic masses. Heterogeneous echotexture in the liver. Nodular contour on the CT scan suggesting  cirrhosis, not as well appreciated on this study. Recommend an MRI as an outpatient for further evaluation of the liver and to evaluate the apparent underlying masses. 2. Small echogenic kidneys with cysts suggesting medical renal disease. 3. Atherosclerotic changes in the nonaneurysmal aorta. Electronically Signed   By: Dorise Bullion III M.D   On: 02/13/2018 20:13   Mr Liver W Wo Contrast  Result Date: 02/24/2018 CLINICAL DATA:  Liver masses on ultrasound from 1 day prior. Right lower lobe lung mass and mediastinal adenopathy on chest CT from 1 day prior. EXAM: MRI ABDOMEN WITHOUT AND WITH CONTRAST TECHNIQUE: Multiplanar multisequence MR imaging of the abdomen was performed both before and after the administration of intravenous contrast. CONTRAST:  4 cc Gadavist IV. COMPARISON:  01/31/2018 abdominal sonogram and chest CT. 05/03/2009 CT abdomen/pelvis. FINDINGS: Limited motion degraded scan with limited IV contrast enhancement. Lower chest: Right lower lobe 3.2 cm irregular lung mass is again demonstrated (series 15/image 80). Right hilar/infrahilar and  subcarinal and paratracheal mediastinal adenopathy is partially visualized on this scan, better seen on chest CT from 1 day prior. Hepatobiliary: Liver is enlarged. No hepatic steatosis. There are innumerable similar confluent hypoenhancing liver masses replacing much of the liver parenchyma, all new since 05/03/2009 CT abdomen study, each demonstrating mild T2 hyperintensity and T1 hypointensity, compatible with widespread liver metastatic disease. Representative liver masses as follows: -segment 4B left liver lobe 8.0 x 5.3 cm mass (series 5/image 25) -segment 7 right liver lobe 5.0 x 4.2 cm mass (series 5/image 11) -segment 2 left liver lobe 4.5 x 3.4 cm mass (series 5/image 9) Normal gallbladder with no cholelithiasis. There are scattered regions of mild peripheral intrahepatic biliary ductal dilatation. Normal caliber common bile duct with diameter 4 mm.  No choledocholithiasis. Pancreas: No pancreatic mass or duct dilation.  No pancreas divisum. Spleen: Normal size. No mass. Adrenals/Urinary Tract: Normal adrenals. No hydronephrosis. Several simple renal cysts in both kidneys, largest an exophytic simple 2.9 cm lateral lower left renal cyst. Subcentimeter T1 hyperintense T2 hypointense renal cortical lesion in the posterior lower right kidney without appreciable enhancement, compatible with a tiny hemorrhagic/proteinaceous Bosniak category 2 renal cyst. Stomach/Bowel: Normal non-distended stomach. Visualized small and large bowel is normal caliber, with no bowel wall thickening. Vascular/Lymphatic: Nonaneurysmal abdominal aorta. Unable to assess venous patency given limited IV contrast enhancement. No pathologically enlarged lymph nodes in the abdomen. Other: Small volume right perihepatic/right pericolic gutter ascites. No focal fluid collection. Musculoskeletal: Widespread patchy osseous lesions throughout the visualized thoracolumbar spine and bilateral pelvic skeleton. IMPRESSION: 1. Irregular 3.2 cm right lower lobe lung mass, for which primary bronchogenic carcinoma is the diagnosis of exclusion. Partially visualized right hilar/infrahilar and subcarinal/paratracheal mediastinal adenopathy suspicious for nodal metastases. These findings were better depicted on the chest CT study from 02/19/2018. 2. Widespread liver metastases, enlarging the liver and replacing much of the liver parenchyma. 3. Scattered regions of peripheral intrahepatic biliary ductal dilatation throughout the liver with normal caliber CBD. 4. Widespread patchy osseous lesions throughout the visualized axial skeleton most compatible with widespread osseous metastases. 5. Small volume right perihepatic/right pericolic gutter ascites. Electronically Signed   By: Ilona Sorrel M.D.   On: 02/24/2018 16:25   US Biopsy (liver)  Result Date: 02/26/2018 INDICATION: Concern for metastatic lung  cancer. Please perform ultrasound-guided liver lesion biopsy for tissue diagnostic purposes. EXAM: ULTRASOUND GUIDED LIVER LESION BIOPSY COMPARISON:  Chest CT-02/12/2018; abdominal ultrasound-02/12/2018; abdominal MRI-02/24/2018 MEDICATIONS: None ANESTHESIA/SEDATION: Fentanyl 12.5 mcg IV; Versed 0.5 mg IV Total Moderate Sedation time:  13 Minutes. The patient's level of consciousness and vital signs were monitored continuously by radiology nursing throughout the procedure under my direct supervision. COMPLICATIONS: None immediate. PROCEDURE: Informed written consent was obtained from the patient after a discussion of the risks, benefits and alternatives to treatment. The patient understands and consents the procedure. A timeout was performed prior to the initiation of the procedure. Ultrasound scanning was performed of the right upper abdominal quadrant demonstrates multiple mixed echogenic lesions and masses scattered throughout the liver compatible with findings on preceding abdominal ultrasound and MRI. Note is made of a small amount of perihepatic ascites. An approximately 3.4 x 3.0 cm mass within the caudal aspect the right lobe of the liver correlating with the lesion seen on image 12, series 5 preceding abdominal MRI was targeted for biopsy given sonographic window and lesion location with interposed normal hepatic parenchyma. The procedure was planned. The right upper abdominal quadrant was prepped and draped in the usual sterile  fashion. The overlying soft tissues were anesthetized with 1% lidocaine with epinephrine. A 17 gauge, 6.8 cm co-axial needle was advanced into a peripheral aspect of the lesion. This was followed by 5 core biopsies with an 18 gauge core device under direct ultrasound guidance. The coaxial needle tract was embolized with a small amount of Gel-Foam slurry and superficial hemostasis was obtained with manual compression. Post procedural scanning was negative for definitive area of  hemorrhage or additional complication. A dressing was placed. The patient tolerated the procedure well without immediate post procedural complication. IMPRESSION: Technically successful ultrasound guided core needle biopsy of indeterminate lesion within the caudal aspect of the right lobe of the liver. Electronically Signed   By: Sandi Mariscal M.D.   On: 02/26/2018 10:13     LOS: 4 days   Oren Binet, MD  [facility]  If 7PM-7AM, please contact night-coverage  Please page via [email or web address]-Password [email or web address]-click on MD name and type text message  02/27/2018, 11:28 AM

## 2018-02-27 NOTE — Progress Notes (Signed)
Physical Therapy Treatment Patient Details Name: Jenny Sexton MRN: [id number] DOB: 07-07-1931 Today's Date: 02/27/2018    History of Present Illness Patient is an 84 y/o female presenting to the ED on 01/29/2018 with primary complaints of worsening SOB and generalized weakness. CT - Moderate focal nodularity over the right lower lobe centered over the airways with the largest area of nodularity measuring 2.7 x 3.3 cm. Central right lower lobe nodule abutting the inferior pulmonary vein measuring 2 x 2.8 cm; possible liver mass. PMH significant for essential hypertension, hyperlipidemia, 60+ current tobacco user, history of colonic polyps and family history of colon cancer.    PT Comments    Pt remains very limited secondary to fatigue and generalized weakness. Pt able to perform sit<>stand x2 with min A and stand-pivot transfers x2 with mod A. Pt's granddaughter present throughout session, engaged and active in pt's care. Pt would continue to benefit from skilled physical therapy services at this time while admitted and after d/c to address the below listed limitations in order to improve overall safety and independence with functional mobility.    Follow Up Recommendations  SNF;Supervision/Assistance - 24 hour     Equipment Recommendations  None recommended by PT    Recommendations for Other Services       Precautions / Restrictions Precautions Precautions: Fall Restrictions Weight Bearing Restrictions: No    Mobility  Bed Mobility Overal bed mobility: Needs Assistance Bed Mobility: Supine to Sit;Sit to Supine     Supine to sit: Min assist Sit to supine: Min assist   General bed mobility comments: increased time and effort, min A overall for trunk management and to return bilateral LEs back onto bed  Transfers Overall transfer level: Needs assistance Equipment used: 1 person hand held assist Transfers: Sit to/from [facility] Sit to Stand: Min  assist Stand pivot transfers: Mod assist       General transfer comment: increased time and effort, assist to power into standing and for stability with pivotal movement to [facility] and back to bed  Ambulation/Gait             General Gait Details: deferred secondary to fatigue and persistent dizziness   Stairs             Wheelchair Mobility    Modified Rankin (Stroke Patients Only)       Balance Overall balance assessment: Needs assistance Sitting-balance support: Feet supported Sitting balance-Leahy Scale: Fair     Standing balance support: Single extremity supported;Bilateral upper extremity supported Standing balance-Leahy Scale: Poor                              Cognition Arousal/Alertness: Awake/alert Behavior During Therapy: WFL for tasks assessed/performed Overall Cognitive Status: Impaired/Different from baseline Area of Impairment: Following commands;Safety/judgement;Problem solving                       Following Commands: Follows one step commands consistently;Follows one step commands with increased time Safety/Judgement: Decreased awareness of deficits;Decreased awareness of safety   Problem Solving: Slow processing;Decreased initiation;Difficulty sequencing;Requires verbal cues;Requires tactile cues        Exercises      General Comments        Pertinent Vitals/Pain Pain Assessment: No/denies pain    Home Living                      Prior Function  PT Goals (current goals can now be found in the care plan section) Acute Rehab PT Goals PT Goal Formulation: With patient Time For Goal Achievement: 03/11/18 Potential to Achieve Goals: Fair Progress towards PT goals: Progressing toward goals    Frequency    Min 2X/week      PT Plan Current plan remains appropriate;Frequency needs to be updated    Co-evaluation              AM-PAC PT "6 Clicks" Mobility   Outcome Measure   Help needed turning from your back to your side while in a flat bed without using bedrails?: A Little Help needed moving from lying on your back to sitting on the side of a flat bed without using bedrails?: A Little Help needed moving to and from a bed to a chair (including a wheelchair)?: A Little Help needed standing up from a chair using your arms (e.g., wheelchair or bedside chair)?: A Little Help needed to walk in hospital room?: A Lot Help needed climbing 3-5 steps with a railing? : Total 6 Click Score: 15    End of Session Equipment Utilized During Treatment: Gait belt Activity Tolerance: Patient limited by fatigue Patient left: in bed;with call bell/phone within reach;with family/visitor present;with SCD's reapplied Nurse Communication: Mobility status PT Visit Diagnosis: Other abnormalities of gait and mobility (R26.89);Muscle weakness (generalized) (M62.81)     Time: 2202-5427 PT Time Calculation (min) (ACUTE ONLY): 25 min  Charges:  $Therapeutic Activity: 23-37 mins                     Sherie Don, [place], DPT  Acute Rehabilitation Services Pager [phone] Office [facility] 02/27/2018, 10:20 AM

## 2018-02-27 NOTE — Evaluation (Signed)
Clinical/Bedside Swallow Evaluation Patient Details  Name: Jenny Sexton MRN: [id number] Date of Birth: [birth year]  Today's Date: 02/27/2018 Time: SLP Start Time (ACUTE ONLY): 1045 SLP Stop Time (ACUTE ONLY): 1115 SLP Time Calculation (min) (ACUTE ONLY): 30 min  Past Medical History:  Past Medical History:  Diagnosis Date  . Cardiomegaly    Stable  . Chest pain    Occasional non cardiac  . COPD (chronic obstructive pulmonary disease) ([place])    With ongoing smoking  . Glaucoma   . Hypercholesterolemia   . Hypertension    ESSENTIAL  . Osteoporosis   . Pelvic relaxation    with pessary  . Psoriasis   . Tobacco abuse    Past Surgical History:  Past Surgical History:  Procedure Laterality Date  . OOPHORECTOMY Left   . TONSILLECTOMY     HPI:  Jenny Sexton is a 84 y.o. female with past medical history significant for essential hypertension, hyperlipidemia, 60+ current tobacco user, history of colonic polyps and family history of colon cancer in her mother who presented to [facility] ED accompanied by her granddaughter with complaints of worsening shortness of breath and generalized weakness of 2 weeks duration.  Associated with dark stools and intermittent dizziness, patient reports she recently had a colonoscopy done at [facility] and it was unremarkable, also unintentional weight loss 10 to 20 pounds. 60+ current smoker. workup showed -heme positive stool, leukocytosis, anemia, ARF, CT showed - central right lower lobe bronchogenic neoplasm.   Assessment / Plan / Recommendation Clinical Impression  Pt demonstrates impaired oral function due to lack of saliva (xerostomia). She demonstrates capacity to swallow liquids and purees without effort or grimace. No complaint from pt. When given solids however, pt nibbles and is not able to form any bolus as no saliva is present, even after extensive oral care. Pt has several medications (mucinex, inhalers) that could be contributing. If meds cannot  be modified compensatory strategies/treatment include softening solids with extra gravy/sauce/soups, following bites with sips and even applying some humidified air to pts mouth/nose. Made suggestions to RN and MD and family. There may also be medications that could help pt produce saliva. Given her COPD, reiterated basic precautions regarding breathing and swallowing as well. Will f/u to reinforce suggestions.  SLP Visit Diagnosis: Dysphagia, oral phase (R13.11)    Aspiration Risk  Risk for inadequate nutrition/hydration    Diet Recommendation Dysphagia 2 (Fine chop);Thin liquid   Liquid Administration via: Cup;Straw Medication Administration: Whole meds with puree Supervision: Patient able to self feed Compensations: Minimize environmental distractions;Slow rate;Small sips/bites;Follow solids with liquid Postural Changes: Seated upright at 90 degrees    Other  Recommendations     Follow up Recommendations 24 hour supervision/assistance      Frequency and Duration            Prognosis Prognosis for Safe Diet Advancement: Good      Swallow Study   General HPI: Jenny Sexton is a 84 y.o. female with past medical history significant for essential hypertension, hyperlipidemia, 60+ current tobacco user, history of colonic polyps and family history of colon cancer in her mother who presented to [facility] ED accompanied by her granddaughter with complaints of worsening shortness of breath and generalized weakness of 2 weeks duration.  Associated with dark stools and intermittent dizziness, patient reports she recently had a colonoscopy done at [facility] and it was unremarkable, also unintentional weight loss 10 to 20 pounds. 60+ current smoker. workup showed -heme positive stool,  leukocytosis, anemia, ARF, CT showed - central right lower lobe bronchogenic neoplasm. Type of Study: Bedside Swallow Evaluation Previous Swallow Assessment: none Diet Prior to this Study: Regular;Thin  liquids Temperature Spikes Noted: No Respiratory Status: Room air History of Recent Intubation: No Behavior/Cognition: Alert;Cooperative;Pleasant mood;Requires cueing Oral Cavity Assessment: Dry Oral Care Completed by SLP: Yes Oral Cavity - Dentition: Missing dentition Vision: Functional for self-feeding Self-Feeding Abilities: Able to feed self Patient Positioning: Upright in bed Baseline Vocal Quality: Normal Volitional Cough: Strong Volitional Swallow: Able to elicit    Oral/Motor/Sensory Function Overall Oral Motor/Sensory Function: Within functional limits   Ice Chips Ice chips: Not tested   Thin Liquid Thin Liquid: Impaired Presentation: Cup;Straw;Self Fed Pharyngeal  Phase Impairments: Cough - Immediate    Nectar Thick Nectar Thick Liquid: Not tested   Honey Thick Honey Thick Liquid: Not tested   Puree Puree: Within functional limits Presentation: Spoon   Solid     Solid: Impaired Presentation: Self Fed Oral Phase Impairments: Impaired mastication Oral Phase Functional Implications: Oral residue;Impaired mastication;Prolonged oral transit     Herbie [place], MA [place] Pager [phone] Office [phone]  Othelia Pulling, Katherene Ponto 02/27/2018,1:33 PM

## 2018-02-28 LAB — BASIC METABOLIC PANEL
Anion gap: 13 (ref 5–15)
BUN: 68 mg/dL — AB (ref 8–23)
CO2: 21 mmol/L — ABNORMAL LOW (ref 22–32)
Calcium: 8.4 mg/dL — ABNORMAL LOW (ref 8.9–10.3)
Chloride: 109 mmol/L (ref 98–111)
Creatinine, Ser: 1.5 mg/dL — ABNORMAL HIGH (ref 0.44–1.00)
GFR calc Af Amer: 37 mL/min — ABNORMAL LOW (ref >60)
GFR calc non Af Amer: 32 mL/min — ABNORMAL LOW (ref >60)
Glucose, Bld: 125 mg/dL — ABNORMAL HIGH (ref 70–99)
POTASSIUM: 3.6 mmol/L (ref 3.5–5.1)
Sodium: 143 mmol/L (ref 135–145)

## 2018-02-28 LAB — CBC
HCT: 31.6 % — ABNORMAL LOW (ref 36.0–46.0)
Hemoglobin: 10.1 g/dL — ABNORMAL LOW (ref 12.0–15.0)
MCH: 30.9 pg (ref 26.0–34.0)
MCHC: 32 g/dL (ref 30.0–36.0)
MCV: 96.6 fL (ref 80.0–100.0)
Platelets: 79 10*3/uL — ABNORMAL LOW (ref 150–400)
RBC: 3.27 MIL/uL — ABNORMAL LOW (ref 3.87–5.11)
RDW: 18.6 % — ABNORMAL HIGH (ref 11.5–15.5)
WBC: 17 10*3/uL — ABNORMAL HIGH (ref 4.0–10.5)
nRBC: 0.5 % — ABNORMAL HIGH (ref 0.0–0.2)

## 2018-02-28 MED ORDER — MORPHINE SULFATE (CONCENTRATE) 10 MG/0.5ML PO SOLN
5.0000 mg | ORAL | Status: DC | PRN
  Administered 2018-03-01: 5 mg via SUBLINGUAL
  Filled 2018-02-28: qty 0.5

## 2018-02-28 MED ORDER — LORAZEPAM 1 MG PO TABS: 1.0000 mg | ORAL_TABLET | ORAL | Status: DC | PRN

## 2018-02-28 MED ORDER — ENSURE ENLIVE PO LIQD: 237.0000 mL | Freq: Two times a day (BID) | ORAL | 12 refills | Status: DC

## 2018-02-28 MED ORDER — LORAZEPAM 2 MG/ML PO CONC: 1.0000 mg | ORAL | Status: DC | PRN

## 2018-02-28 MED ORDER — IPRATROPIUM-ALBUTEROL 0.5-2.5 (3) MG/3ML IN SOLN: 3.0000 mL | Freq: Four times a day (QID) | RESPIRATORY_TRACT | Status: DC | PRN

## 2018-02-28 MED ORDER — ORAL CARE MOUTH RINSE
15.0000 mL | Freq: Two times a day (BID) | OROMUCOSAL | Status: DC
  Administered 2018-02-28: 15 mL via OROMUCOSAL

## 2018-02-28 MED ORDER — PANTOPRAZOLE SODIUM 40 MG PO TBEC: 40.0000 mg | DELAYED_RELEASE_TABLET | Freq: Every day | ORAL | Status: DC

## 2018-02-28 MED ORDER — POLYVINYL ALCOHOL 1.4 % OP SOLN
1.0000 [drp] | Freq: Four times a day (QID) | OPHTHALMIC | Status: DC | PRN
  Filled 2018-02-28: qty 15

## 2018-02-28 MED ORDER — DIPHENHYDRAMINE HCL 50 MG/ML IJ SOLN: 12.5000 mg | INTRAMUSCULAR | Status: DC | PRN

## 2018-02-28 MED ORDER — NICOTINE 21 MG/24HR TD PT24: 21.0000 mg | MEDICATED_PATCH | Freq: Every day | TRANSDERMAL | 0 refills | Status: DC

## 2018-02-28 MED ORDER — MORPHINE SULFATE (CONCENTRATE) 10 MG/0.5ML PO SOLN: 5.0000 mg | ORAL | Status: DC | PRN

## 2018-02-28 MED ORDER — CHLORHEXIDINE GLUCONATE 0.12 % MT SOLN
15.0000 mL | Freq: Two times a day (BID) | OROMUCOSAL | Status: DC
  Administered 2018-02-28 (×2): 15 mL via OROMUCOSAL
  Filled 2018-02-28: qty 15

## 2018-02-28 MED ORDER — LORAZEPAM 2 MG/ML IJ SOLN: 1.0000 mg | INTRAMUSCULAR | Status: DC | PRN

## 2018-02-28 NOTE — Discharge Summary (Addendum)
PATIENT DETAILS Name: Jenny Sexton Age: 84 y.o. Sex: female Date of Birth: [birth year] MRN: [id number]. Admitting Physician: Kayleen Memos, DO [id number], Jenny Reichmann, MD  Admit Date: 02/21/2018 Discharge date: 02/28/2018  Recommendations for Outpatient Follow-up:  1. Follow up with PCP in 1-2 weeks 2. Please obtain BMP/CBC in one week 3. Please ensure follow-up with oncology-referral has been sent per epic  Admitted From:  Home  Disposition: SNF   Home Health: No  Equipment/Devices: None  Discharge Condition: Stable  CODE STATUS: DNR  Diet recommendation:  Regular -but dys 2 diet (see below)  Brief Summary: See H&P, Labs, Consult and Test reports for all details in brief, Patient is a 84 y.o. female with history of hypertension, dyslipidemia, extensive history of tobacco use who presented with worsening generalized weakness,shortness of breath, intermittent dark-colored stools, and dizziness-she has had unintentional weight loss of approximately 20 pounds in the past few months-further work-up revealed a right lower lobe mass with mediastinal and hilar lymphadenopathy along with multiple hepatic masses.  Underwent ultrasound-guided liver biopsy on 12/3-results of which are currently pending.  See below for further details  Brief Hospital Course: Right lower lobe lung mass with possible liver and osseous metastases: Underwent ultrasound-guided liver biopsy on 12/3-results still pending-spoke with oncology on call-Dr. Alen Blew on 12/4-I sent a epic message over to his office-he will then let their navigator know to arrange outpatient follow-up.  Appears very frail and cachectic-overall prognosis is probably poor at this point.  Further management of lung mass/liver mets will be deferred to the outpatient setting-but may be best served by transitioning to hospice care at some point.  Hypernatremia: Secondary to poor oral intake-resolved with gentle-patient with hypotonic saline.   Encouraged free water intake.   Hypokalemia: Repleted-recheck periodically at SNF.  Anemia: Suspect multifactorial-likely secondary to anemia chronic disease-has heme positive stools without any overt GI bleeding.  Per prior notes-patient had a endoscopy done at [facility] recently that was unremarkable.  Anemia panel consistent with anemia of chronic disease.  Thrombocytopenia:-This may be due to bone marrow involvement/osseous metastases from underlying lung cancer.  Since platelet counts are improving-doubt further work-up is required-continue outpatient monitoring for now.  COPD: Appears stable-continue as needed bronchodilators.  Mild acute kidney injury: Creatinine downtrending-suspect may have developed some chronic kidney disease over the past few years as creatinine seems to have plateaued.  Elevated transaminases: Probably related to liver metastases.  Dysphagia: Evaluated by speech therapy on a dysphagia 2 diet.  Family aware of poor overall prognosis and risks of aspiration.  Procedures/Studies: 12/3>> ultrasound-guided biopsy of the liver  Discharge Diagnoses:  Active Problems:   Hypercholesterolemia   Tobacco abuse   Symptomatic anemia   Melena   Right lower lobe lung mass   Discharge Instructions:  Activity:  As tolerated with Full fall precautions use walker/cane & assistance as needed  Discharge Instructions    Call MD for:  difficulty breathing, headache or visual disturbances   Complete by:  As directed    Diet general   Complete by:  As directed    Dysphagia 2 (Fine chop);Thin liquid  Liquid Administration via: Cup;Straw Medication Administration: Whole meds with puree Supervision: Patient able to self feed Compensations: Minimize environmental distractions;Slow rate;Small sips/bites;Follow solids with liquid Postural Changes: Seated upright at 90 degrees     Increase activity slowly   Complete by:  As directed      Allergies as of 02/28/2018       Reactions   Amoxicillin  Codeine    nausea      Medication List    STOP taking these medications   atenolol 50 MG tablet Commonly known as:  TENORMIN   atorvastatin 10 MG tablet Commonly known as:  LIPITOR   triamterene-hydrochlorothiazide 37.5-25 MG capsule Commonly known as:  DYAZIDE     TAKE these medications   ALPHAGAN P 0.1 % Soln Generic drug:  brimonidine 1 drop both eyes twice a day   cetirizine 10 MG tablet Commonly known as:  ZYRTEC Take 10 mg by mouth daily.   dorzolamide-timolol 22.3-6.8 MG/ML ophthalmic solution Commonly known as:  COSOPT 1 drop 2 (two) times daily.   feeding supplement (ENSURE ENLIVE) Liqd Take 237 mLs by mouth 2 (two) times daily between meals.   ipratropium-albuterol 0.5-2.5 (3) MG/3ML Soln Commonly known as:  DUONEB Take 3 mLs by nebulization every 6 (six) hours as needed.   MULTIVITAMIN ADULT PO Take by mouth.   nicotine 21 mg/24hr patch Commonly known as:  NICODERM CQ - dosed in mg/24 hours Place 1 patch (21 mg total) onto the skin daily.   pantoprazole 40 MG tablet Commonly known as:  PROTONIX Take 1 tablet (40 mg total) by mouth daily.   Travoprost (BAK Free) 0.004 % Soln ophthalmic solution Commonly known as:  TRAVATAN 1 drop at bedtime.   VENTOLIN HFA 108 (90 Base) MCG/ACT inhaler Generic drug:  albuterol Inhale 2 puffs into the lungs every 6 (six) hours as needed.       Contact information for follow-up providers    Shon Baton, MD. Schedule an appointment as soon as possible for a visit in 1 week(s).   Specialty:  Internal Medicine Contact information: [place] [address] [place] Follow up.   Why:  office will call for a follow up appointment           Contact information for after-discharge care    Destination    [facility] .   Service:  Skilled Nursing Contact information: [address] [place] [phone]                 Allergies  Allergen Reactions  . Amoxicillin   . Codeine     nausea    Consultations:   None  Other Procedures/Studies: Dg Chest 2 View  Result Date: 02/21/2018 CLINICAL DATA:  Generalized weakness with dizziness and shortness of breath 1 week. Smoker. EXAM: CHEST - 2 VIEW COMPARISON:  07/13/2011 FINDINGS: Lungs are somewhat hyperexpanded without focal airspace consolidation or effusion. Cardiomediastinal silhouette is within normal. There is prominence of the right hilum with somewhat lobulated density over the infrahilar region on the lateral film as findings may be due to adenopathy/mass. Remainder of the exam is unchanged. IMPRESSION: No acute cardiopulmonary disease. Increased prominence of the right hilum with possible adenopathy/mass in the infrahilar region on the lateral film. Recommend contrast-enhanced chest CT for further evaluation. Electronically Signed   By: Marin Olp M.D.   On: 02/14/2018 14:09   Ct Head Wo Contrast  Result Date: 02/26/2018 CLINICAL DATA:  Decreased mental status EXAM: CT HEAD WITHOUT CONTRAST TECHNIQUE: Contiguous axial images were obtained from the base of the skull through the vertex without intravenous contrast. COMPARISON:  None. FINDINGS: Brain: Atrophic changes are noted as well as mild chronic white matter ischemic change. No findings to suggest acute hemorrhage, acute infarction or space-occupying mass lesion are noted.  Vascular: No hyperdense vessel or unexpected calcification. Skull: Normal. Negative for fracture or focal lesion. Sinuses/Orbits: No acute finding. Other: None. IMPRESSION: Chronic atrophic and ischemic changes without acute abnormality. Electronically Signed   By: Inez Catalina M.D.   On: 02/26/2018 15:28   Ct Chest Wo Contrast  Result Date: 02/12/2018 CLINICAL DATA:  Generalized weakness which shortness-of-breath one week. Acute renal insufficiency. EXAM: CT CHEST WITHOUT  CONTRAST TECHNIQUE: Multidetector CT imaging of the chest was performed following the standard protocol without IV contrast. COMPARISON:  CT abdomen/pelvis 05/03/2009 FINDINGS: Cardiovascular: Heart is normal size. Calcified plaque over the coronary arteries. Calcified plaque over the thoracic aorta. Remaining vascular structures are unremarkable. Mediastinum/Nodes: It is difficult to assess mediastinal and hilar adenopathy due to lack of intravenous contrast. There is moderate mediastinal adenopathy. There is a 2 cm precarinal lymph node and 1.6 cm AP window lymph node. 1.6 cm and 1.7 cm subcarinal lymph nodes. Likely mild right hilar adenopathy. Remaining mediastinal structures are unremarkable. Lungs/Pleura: Lungs are adequately inflated demonstrate moderate diffuse emphysematous disease. There is mild biapical pleural thickening. There are several small scattered bilateral subcentimeter calcified granulomas. No focal airspace process or effusion. There is a nodule over the right infrahilar region measuring 2 x 2.8 cm with moderate nodularity central over the airways of the central right lower lobe. The largest masslike area of nodularity measures 2.7 x 3.3 cm. Upper Abdomen: Mild hepatomegaly with somewhat nodular liver contour. 1.6 cm cyst over the upper pole left kidney. Calcified plaque over the abdominal aorta. Musculoskeletal: Degenerative change of the spine IMPRESSION: Moderate focal nodularity over the right lower lobe centered over the airways with the largest area of nodularity measuring 2.7 x 3.3 cm. Central right lower lobe nodule abutting the inferior pulmonary vein measuring 2 x 2.8 cm. There is moderate mediastinal and likely right hilar adenopathy. These findings may be due to central right lower lobe bronchogenic neoplasm. Recommend thoracic surgery consultation. Moderate emphysematous disease and evidence of prior granulomas disease. Aortic Atherosclerosis (ICD10-I70.0). Atherosclerotic  coronary artery disease. 1.6 cm left renal cyst. Mild hepatomegaly. Electronically Signed   By: Marin Olp M.D.   On: 02/10/2018 18:29   US Abdomen Complete  Result Date: 02/04/2018 CLINICAL DATA:  Transaminitis.  Thrombocytopenia. EXAM: ABDOMEN ULTRASOUND COMPLETE COMPARISON:  CT scan February 23, 2018 FINDINGS: Gallbladder: No gallstones or wall thickening visualized. No sonographic Murphy sign noted by sonographer. Common bile duct: Diameter: 2.9 mm Liver: The liver is heterogeneous with multiple apparent masses. A representative mass in the right hepatic lobe measures 5.5 x 4.9 x 5.6 cm. The liver is nodular in contour on today's CT of the chest raising the possibility of cirrhosis. Portal vein is patent on color Doppler imaging with normal direction of blood flow towards the liver. IVC: Poorly visualized but grossly unremarkable. Pancreas: Poorly visualized but grossly unremarkable. Spleen: Size and appearance within normal limits. Right Kidney: Length: 8.2 cm. Increased cortical echogenicity. Contains 2 cysts with the largest measuring 1.7 cm. No hydronephrosis. Left Kidney: Length: 8.4 cm. Contains 2 cysts with the largest measuring 3.2 cm. Increased cortical echogenicity. Abdominal aorta: Atherosclerotic change in the nonaneurysmal aorta. Other findings: None. IMPRESSION: 1. Apparent hepatic masses. Heterogeneous echotexture in the liver. Nodular contour on the CT scan suggesting cirrhosis, not as well appreciated on this study. Recommend an MRI as an outpatient for further evaluation of the liver and to evaluate the apparent underlying masses. 2. Small echogenic kidneys with cysts suggesting medical renal disease. 3. Atherosclerotic changes in  the nonaneurysmal aorta. Electronically Signed   By: Dorise Bullion III M.D   On: 01/27/2018 20:13   Mr Liver W Wo Contrast  Result Date: 02/24/2018 CLINICAL DATA:  Liver masses on ultrasound from 1 day prior. Right lower lobe lung mass and mediastinal  adenopathy on chest CT from 1 day prior. EXAM: MRI ABDOMEN WITHOUT AND WITH CONTRAST TECHNIQUE: Multiplanar multisequence MR imaging of the abdomen was performed both before and after the administration of intravenous contrast. CONTRAST:  4 cc Gadavist IV. COMPARISON:  02/06/2018 abdominal sonogram and chest CT. 05/03/2009 CT abdomen/pelvis. FINDINGS: Limited motion degraded scan with limited IV contrast enhancement. Lower chest: Right lower lobe 3.2 cm irregular lung mass is again demonstrated (series 15/image 80). Right hilar/infrahilar and subcarinal and paratracheal mediastinal adenopathy is partially visualized on this scan, better seen on chest CT from 1 day prior. Hepatobiliary: Liver is enlarged. No hepatic steatosis. There are innumerable similar confluent hypoenhancing liver masses replacing much of the liver parenchyma, all new since 05/03/2009 CT abdomen study, each demonstrating mild T2 hyperintensity and T1 hypointensity, compatible with widespread liver metastatic disease. Representative liver masses as follows: -segment 4B left liver lobe 8.0 x 5.3 cm mass (series 5/image 25) -segment 7 right liver lobe 5.0 x 4.2 cm mass (series 5/image 11) -segment 2 left liver lobe 4.5 x 3.4 cm mass (series 5/image 9) Normal gallbladder with no cholelithiasis. There are scattered regions of mild peripheral intrahepatic biliary ductal dilatation. Normal caliber common bile duct with diameter 4 mm. No choledocholithiasis. Pancreas: No pancreatic mass or duct dilation.  No pancreas divisum. Spleen: Normal size. No mass. Adrenals/Urinary Tract: Normal adrenals. No hydronephrosis. Several simple renal cysts in both kidneys, largest an exophytic simple 2.9 cm lateral lower left renal cyst. Subcentimeter T1 hyperintense T2 hypointense renal cortical lesion in the posterior lower right kidney without appreciable enhancement, compatible with a tiny hemorrhagic/proteinaceous Bosniak category 2 renal cyst. Stomach/Bowel:  Normal non-distended stomach. Visualized small and large bowel is normal caliber, with no bowel wall thickening. Vascular/Lymphatic: Nonaneurysmal abdominal aorta. Unable to assess venous patency given limited IV contrast enhancement. No pathologically enlarged lymph nodes in the abdomen. Other: Small volume right perihepatic/right pericolic gutter ascites. No focal fluid collection. Musculoskeletal: Widespread patchy osseous lesions throughout the visualized thoracolumbar spine and bilateral pelvic skeleton. IMPRESSION: 1. Irregular 3.2 cm right lower lobe lung mass, for which primary bronchogenic carcinoma is the diagnosis of exclusion. Partially visualized right hilar/infrahilar and subcarinal/paratracheal mediastinal adenopathy suspicious for nodal metastases. These findings were better depicted on the chest CT study from 02/22/2018. 2. Widespread liver metastases, enlarging the liver and replacing much of the liver parenchyma. 3. Scattered regions of peripheral intrahepatic biliary ductal dilatation throughout the liver with normal caliber CBD. 4. Widespread patchy osseous lesions throughout the visualized axial skeleton most compatible with widespread osseous metastases. 5. Small volume right perihepatic/right pericolic gutter ascites. Electronically Signed   By: Ilona Sorrel M.D.   On: 02/24/2018 16:25   US Biopsy (liver)  Result Date: 02/26/2018 INDICATION: Concern for metastatic lung cancer. Please perform ultrasound-guided liver lesion biopsy for tissue diagnostic purposes. EXAM: ULTRASOUND GUIDED LIVER LESION BIOPSY COMPARISON:  Chest CT-01/28/2018; abdominal ultrasound-01/30/2018; abdominal MRI-02/24/2018 MEDICATIONS: None ANESTHESIA/SEDATION: Fentanyl 12.5 mcg IV; Versed 0.5 mg IV Total Moderate Sedation time:  13 Minutes. The patient's level of consciousness and vital signs were monitored continuously by radiology nursing throughout the procedure under my direct supervision. COMPLICATIONS: None  immediate. PROCEDURE: Informed written consent was obtained from the patient after a discussion  of the risks, benefits and alternatives to treatment. The patient understands and consents the procedure. A timeout was performed prior to the initiation of the procedure. Ultrasound scanning was performed of the right upper abdominal quadrant demonstrates multiple mixed echogenic lesions and masses scattered throughout the liver compatible with findings on preceding abdominal ultrasound and MRI. Note is made of a small amount of perihepatic ascites. An approximately 3.4 x 3.0 cm mass within the caudal aspect the right lobe of the liver correlating with the lesion seen on image 12, series 5 preceding abdominal MRI was targeted for biopsy given sonographic window and lesion location with interposed normal hepatic parenchyma. The procedure was planned. The right upper abdominal quadrant was prepped and draped in the usual sterile fashion. The overlying soft tissues were anesthetized with 1% lidocaine with epinephrine. A 17 gauge, 6.8 cm co-axial needle was advanced into a peripheral aspect of the lesion. This was followed by 5 core biopsies with an 18 gauge core device under direct ultrasound guidance. The coaxial needle tract was embolized with a small amount of Gel-Foam slurry and superficial hemostasis was obtained with manual compression. Post procedural scanning was negative for definitive area of hemorrhage or additional complication. A dressing was placed. The patient tolerated the procedure well without immediate post procedural complication. IMPRESSION: Technically successful ultrasound guided core needle biopsy of indeterminate lesion within the caudal aspect of the right lobe of the liver. Electronically Signed   By: Sandi Mariscal M.D.   On: 02/26/2018 10:13      TODAY-DAY OF DISCHARGE:  Subjective:   Aubrynn Mcdougald today has no headache,no chest abdominal pain,no new weakness tingling or numbness, feels  much better wants to go home today.   Objective:   Blood pressure 111/75, pulse (!) 108, temperature 98.5 F (36.9 C), temperature source Oral, resp. rate (!) 22, weight 42.2 kg, SpO2 100 %.  Intake/Output Summary (Last 24 hours) at 02/28/2018 0901 Last data filed at 02/27/2018 1548 Gross per 24 hour  Intake 671.95 ml  Output -  Net 671.95 ml   Filed Weights   01/30/2018 2057 02/27/18 0648 02/28/18 0500  Weight: 39.6 kg 39.7 kg 42.2 kg    Exam: Awake Alert, Oriented *3, No new F.N deficits, Normal affect [place].AT,PERRAL Supple Neck,No JVD, No cervical lymphadenopathy appriciated.  Symmetrical Chest wall movement, Good air movement bilaterally, CTAB RRR,No Gallops,Rubs or new Murmurs, No Parasternal Heave +ve B.Sounds, Abd Soft, Non tender, No organomegaly appriciated, No rebound -guarding or rigidity. No Cyanosis, Clubbing or edema, No new Rash or bruise   PERTINENT RADIOLOGIC STUDIES: Dg Chest 2 View  Result Date: 02/06/2018 CLINICAL DATA:  Generalized weakness with dizziness and shortness of breath 1 week. Smoker. EXAM: CHEST - 2 VIEW COMPARISON:  07/13/2011 FINDINGS: Lungs are somewhat hyperexpanded without focal airspace consolidation or effusion. Cardiomediastinal silhouette is within normal. There is prominence of the right hilum with somewhat lobulated density over the infrahilar region on the lateral film as findings may be due to adenopathy/mass. Remainder of the exam is unchanged. IMPRESSION: No acute cardiopulmonary disease. Increased prominence of the right hilum with possible adenopathy/mass in the infrahilar region on the lateral film. Recommend contrast-enhanced chest CT for further evaluation. Electronically Signed   By: Marin Olp M.D.   On: 01/31/2018 14:09   Ct Head Wo Contrast  Result Date: 02/26/2018 CLINICAL DATA:  Decreased mental status EXAM: CT HEAD WITHOUT CONTRAST TECHNIQUE: Contiguous axial images were obtained from the base of the skull through the vertex  without  intravenous contrast. COMPARISON:  None. FINDINGS: Brain: Atrophic changes are noted as well as mild chronic white matter ischemic change. No findings to suggest acute hemorrhage, acute infarction or space-occupying mass lesion are noted. Vascular: No hyperdense vessel or unexpected calcification. Skull: Normal. Negative for fracture or focal lesion. Sinuses/Orbits: No acute finding. Other: None. IMPRESSION: Chronic atrophic and ischemic changes without acute abnormality. Electronically Signed   By: Inez Catalina M.D.   On: 02/26/2018 15:28   Ct Chest Wo Contrast  Result Date: 02/10/2018 CLINICAL DATA:  Generalized weakness which shortness-of-breath one week. Acute renal insufficiency. EXAM: CT CHEST WITHOUT CONTRAST TECHNIQUE: Multidetector CT imaging of the chest was performed following the standard protocol without IV contrast. COMPARISON:  CT abdomen/pelvis 05/03/2009 FINDINGS: Cardiovascular: Heart is normal size. Calcified plaque over the coronary arteries. Calcified plaque over the thoracic aorta. Remaining vascular structures are unremarkable. Mediastinum/Nodes: It is difficult to assess mediastinal and hilar adenopathy due to lack of intravenous contrast. There is moderate mediastinal adenopathy. There is a 2 cm precarinal lymph node and 1.6 cm AP window lymph node. 1.6 cm and 1.7 cm subcarinal lymph nodes. Likely mild right hilar adenopathy. Remaining mediastinal structures are unremarkable. Lungs/Pleura: Lungs are adequately inflated demonstrate moderate diffuse emphysematous disease. There is mild biapical pleural thickening. There are several small scattered bilateral subcentimeter calcified granulomas. No focal airspace process or effusion. There is a nodule over the right infrahilar region measuring 2 x 2.8 cm with moderate nodularity central over the airways of the central right lower lobe. The largest masslike area of nodularity measures 2.7 x 3.3 cm. Upper Abdomen: Mild hepatomegaly  with somewhat nodular liver contour. 1.6 cm cyst over the upper pole left kidney. Calcified plaque over the abdominal aorta. Musculoskeletal: Degenerative change of the spine IMPRESSION: Moderate focal nodularity over the right lower lobe centered over the airways with the largest area of nodularity measuring 2.7 x 3.3 cm. Central right lower lobe nodule abutting the inferior pulmonary vein measuring 2 x 2.8 cm. There is moderate mediastinal and likely right hilar adenopathy. These findings may be due to central right lower lobe bronchogenic neoplasm. Recommend thoracic surgery consultation. Moderate emphysematous disease and evidence of prior granulomas disease. Aortic Atherosclerosis (ICD10-I70.0). Atherosclerotic coronary artery disease. 1.6 cm left renal cyst. Mild hepatomegaly. Electronically Signed   By: Marin Olp M.D.   On: 02/11/2018 18:29   US Abdomen Complete  Result Date: 02/20/2018 CLINICAL DATA:  Transaminitis.  Thrombocytopenia. EXAM: ABDOMEN ULTRASOUND COMPLETE COMPARISON:  CT scan February 23, 2018 FINDINGS: Gallbladder: No gallstones or wall thickening visualized. No sonographic Murphy sign noted by sonographer. Common bile duct: Diameter: 2.9 mm Liver: The liver is heterogeneous with multiple apparent masses. A representative mass in the right hepatic lobe measures 5.5 x 4.9 x 5.6 cm. The liver is nodular in contour on today's CT of the chest raising the possibility of cirrhosis. Portal vein is patent on color Doppler imaging with normal direction of blood flow towards the liver. IVC: Poorly visualized but grossly unremarkable. Pancreas: Poorly visualized but grossly unremarkable. Spleen: Size and appearance within normal limits. Right Kidney: Length: 8.2 cm. Increased cortical echogenicity. Contains 2 cysts with the largest measuring 1.7 cm. No hydronephrosis. Left Kidney: Length: 8.4 cm. Contains 2 cysts with the largest measuring 3.2 cm. Increased cortical echogenicity. Abdominal aorta:  Atherosclerotic change in the nonaneurysmal aorta. Other findings: None. IMPRESSION: 1. Apparent hepatic masses. Heterogeneous echotexture in the liver. Nodular contour on the CT scan suggesting cirrhosis, not as well appreciated on this  study. Recommend an MRI as an outpatient for further evaluation of the liver and to evaluate the apparent underlying masses. 2. Small echogenic kidneys with cysts suggesting medical renal disease. 3. Atherosclerotic changes in the nonaneurysmal aorta. Electronically Signed   By: Dorise Bullion III M.D   On: 02/01/2018 20:13   Mr Liver W Wo Contrast  Result Date: 02/24/2018 CLINICAL DATA:  Liver masses on ultrasound from 1 day prior. Right lower lobe lung mass and mediastinal adenopathy on chest CT from 1 day prior. EXAM: MRI ABDOMEN WITHOUT AND WITH CONTRAST TECHNIQUE: Multiplanar multisequence MR imaging of the abdomen was performed both before and after the administration of intravenous contrast. CONTRAST:  4 cc Gadavist IV. COMPARISON:  02/16/2018 abdominal sonogram and chest CT. 05/03/2009 CT abdomen/pelvis. FINDINGS: Limited motion degraded scan with limited IV contrast enhancement. Lower chest: Right lower lobe 3.2 cm irregular lung mass is again demonstrated (series 15/image 80). Right hilar/infrahilar and subcarinal and paratracheal mediastinal adenopathy is partially visualized on this scan, better seen on chest CT from 1 day prior. Hepatobiliary: Liver is enlarged. No hepatic steatosis. There are innumerable similar confluent hypoenhancing liver masses replacing much of the liver parenchyma, all new since 05/03/2009 CT abdomen study, each demonstrating mild T2 hyperintensity and T1 hypointensity, compatible with widespread liver metastatic disease. Representative liver masses as follows: -segment 4B left liver lobe 8.0 x 5.3 cm mass (series 5/image 25) -segment 7 right liver lobe 5.0 x 4.2 cm mass (series 5/image 11) -segment 2 left liver lobe 4.5 x 3.4 cm mass  (series 5/image 9) Normal gallbladder with no cholelithiasis. There are scattered regions of mild peripheral intrahepatic biliary ductal dilatation. Normal caliber common bile duct with diameter 4 mm. No choledocholithiasis. Pancreas: No pancreatic mass or duct dilation.  No pancreas divisum. Spleen: Normal size. No mass. Adrenals/Urinary Tract: Normal adrenals. No hydronephrosis. Several simple renal cysts in both kidneys, largest an exophytic simple 2.9 cm lateral lower left renal cyst. Subcentimeter T1 hyperintense T2 hypointense renal cortical lesion in the posterior lower right kidney without appreciable enhancement, compatible with a tiny hemorrhagic/proteinaceous Bosniak category 2 renal cyst. Stomach/Bowel: Normal non-distended stomach. Visualized small and large bowel is normal caliber, with no bowel wall thickening. Vascular/Lymphatic: Nonaneurysmal abdominal aorta. Unable to assess venous patency given limited IV contrast enhancement. No pathologically enlarged lymph nodes in the abdomen. Other: Small volume right perihepatic/right pericolic gutter ascites. No focal fluid collection. Musculoskeletal: Widespread patchy osseous lesions throughout the visualized thoracolumbar spine and bilateral pelvic skeleton. IMPRESSION: 1. Irregular 3.2 cm right lower lobe lung mass, for which primary bronchogenic carcinoma is the diagnosis of exclusion. Partially visualized right hilar/infrahilar and subcarinal/paratracheal mediastinal adenopathy suspicious for nodal metastases. These findings were better depicted on the chest CT study from 02/19/2018. 2. Widespread liver metastases, enlarging the liver and replacing much of the liver parenchyma. 3. Scattered regions of peripheral intrahepatic biliary ductal dilatation throughout the liver with normal caliber CBD. 4. Widespread patchy osseous lesions throughout the visualized axial skeleton most compatible with widespread osseous metastases. 5. Small volume right  perihepatic/right pericolic gutter ascites. Electronically Signed   By: Ilona Sorrel M.D.   On: 02/24/2018 16:25   US Biopsy (liver)  Result Date: 02/26/2018 INDICATION: Concern for metastatic lung cancer. Please perform ultrasound-guided liver lesion biopsy for tissue diagnostic purposes. EXAM: ULTRASOUND GUIDED LIVER LESION BIOPSY COMPARISON:  Chest CT-01/25/2018; abdominal ultrasound-01/31/2018; abdominal MRI-02/24/2018 MEDICATIONS: None ANESTHESIA/SEDATION: Fentanyl 12.5 mcg IV; Versed 0.5 mg IV Total Moderate Sedation time:  13 Minutes. The patient's level  of consciousness and vital signs were monitored continuously by radiology nursing throughout the procedure under my direct supervision. COMPLICATIONS: None immediate. PROCEDURE: Informed written consent was obtained from the patient after a discussion of the risks, benefits and alternatives to treatment. The patient understands and consents the procedure. A timeout was performed prior to the initiation of the procedure. Ultrasound scanning was performed of the right upper abdominal quadrant demonstrates multiple mixed echogenic lesions and masses scattered throughout the liver compatible with findings on preceding abdominal ultrasound and MRI. Note is made of a small amount of perihepatic ascites. An approximately 3.4 x 3.0 cm mass within the caudal aspect the right lobe of the liver correlating with the lesion seen on image 12, series 5 preceding abdominal MRI was targeted for biopsy given sonographic window and lesion location with interposed normal hepatic parenchyma. The procedure was planned. The right upper abdominal quadrant was prepped and draped in the usual sterile fashion. The overlying soft tissues were anesthetized with 1% lidocaine with epinephrine. A 17 gauge, 6.8 cm co-axial needle was advanced into a peripheral aspect of the lesion. This was followed by 5 core biopsies with an 18 gauge core device under direct ultrasound guidance. The  coaxial needle tract was embolized with a small amount of Gel-Foam slurry and superficial hemostasis was obtained with manual compression. Post procedural scanning was negative for definitive area of hemorrhage or additional complication. A dressing was placed. The patient tolerated the procedure well without immediate post procedural complication. IMPRESSION: Technically successful ultrasound guided core needle biopsy of indeterminate lesion within the caudal aspect of the right lobe of the liver. Electronically Signed   By: Sandi Mariscal M.D.   On: 02/26/2018 10:13     PERTINENT LAB RESULTS: CBC: Recent Labs    02/26/18 0250 02/27/18 0335  WBC 12.5* 15.3*  HGB 8.1* 9.5*  HCT 25.7* 30.6*  PLT 64* 71*   CMET CMP     Component Value Date/Time   NA 143 02/28/2018 0544   K 3.6 02/28/2018 0544   CL 109 02/28/2018 0544   CO2 21 (L) 02/28/2018 0544   GLUCOSE 125 (H) 02/28/2018 0544   BUN 68 (H) 02/28/2018 0544   CREATININE 1.50 (H) 02/28/2018 0544   CALCIUM 8.4 (L) 02/28/2018 0544   PROT 4.8 (L) 02/27/2018 0335   ALBUMIN 2.5 (L) 02/27/2018 0335   AST 189 (H) 02/27/2018 0335   ALT 185 (H) 02/27/2018 0335   ALKPHOS 252 (H) 02/27/2018 0335   BILITOT 1.8 (H) 02/27/2018 0335   GFRNONAA 32 (L) 02/28/2018 0544   GFRAA 37 (L) 02/28/2018 0544    GFR CrCl cannot be calculated (Unknown ideal weight.). No results for input(s): LIPASE, AMYLASE in the last 72 hours. No results for input(s): CKTOTAL, CKMB, CKMBINDEX, TROPONINI in the last 72 hours. Invalid input(s): POCBNP No results for input(s): DDIMER in the last 72 hours. No results for input(s): HGBA1C in the last 72 hours. No results for input(s): CHOL, HDL, LDLCALC, TRIG, CHOLHDL, LDLDIRECT in the last 72 hours. No results for input(s): TSH, T4TOTAL, T3FREE, THYROIDAB in the last 72 hours.  Invalid input(s): FREET3 No results for input(s): VITAMINB12, FOLATE, FERRITIN, TIBC, IRON, RETICCTPCT in the last 72 hours. Coags: No results  for input(s): INR in the last 72 hours.  Invalid input(s): PT Microbiology: Recent Results (from the past 240 hour(s))  Respiratory Panel by PCR     Status: None   Collection Time: 01/29/2018  6:47 PM  Result Value Ref Range Status  Adenovirus NOT DETECTED NOT DETECTED Final   Coronavirus 229E NOT DETECTED NOT DETECTED Final   Coronavirus HKU1 NOT DETECTED NOT DETECTED Final   Coronavirus NL63 NOT DETECTED NOT DETECTED Final   Coronavirus OC43 NOT DETECTED NOT DETECTED Final   Metapneumovirus NOT DETECTED NOT DETECTED Final   Rhinovirus / Enterovirus NOT DETECTED NOT DETECTED Final   Influenza A NOT DETECTED NOT DETECTED Final   Influenza B NOT DETECTED NOT DETECTED Final   Parainfluenza Virus 1 NOT DETECTED NOT DETECTED Final   Parainfluenza Virus 2 NOT DETECTED NOT DETECTED Final   Parainfluenza Virus 3 NOT DETECTED NOT DETECTED Final   Parainfluenza Virus 4 NOT DETECTED NOT DETECTED Final   Respiratory Syncytial Virus NOT DETECTED NOT DETECTED Final   Bordetella pertussis NOT DETECTED NOT DETECTED Final   Chlamydophila pneumoniae NOT DETECTED NOT DETECTED Final   Mycoplasma pneumoniae NOT DETECTED NOT DETECTED Final    Comment: Performed at [facility], [place] [address], [place], [place] [address]    FURTHER DISCHARGE INSTRUCTIONS:  Get Medicines reviewed and adjusted: Please take all your medications with you for your next visit with your Primary MD  Laboratory/radiological data: Please request your Primary MD to go over all hospital tests and procedure/radiological results at the follow up, please ask your Primary MD to get all Hospital records sent to his/her office.  In some cases, they will be blood work, cultures and biopsy results pending at the time of your discharge. Please request that your primary care M.D. goes through all the records of your hospital data and follows up on these results.  Also Note the following: If you experience worsening of your admission  symptoms, develop shortness of breath, life threatening emergency, suicidal or homicidal thoughts you must seek medical attention immediately by calling 911 or calling your MD immediately  if symptoms less severe.  You must read complete instructions/literature along with all the possible adverse reactions/side effects for all the Medicines you take and that have been prescribed to you. Take any new Medicines after you have completely understood and accpet all the possible adverse reactions/side effects.   Do not drive when taking Pain medications or sleeping medications (Benzodaizepines)  Do not take more than prescribed Pain, Sleep and Anxiety Medications. It is not advisable to combine anxiety,sleep and pain medications without talking with your primary care practitioner  Special Instructions: If you have smoked or chewed Tobacco  in the last 2 yrs please stop smoking, stop any regular Alcohol  and or any Recreational drug use.  Wear Seat belts while driving.  Please note: You were cared for by a hospitalist during your hospital stay. Once you are discharged, your primary care physician will handle any further medical issues. Please note that NO REFILLS for any discharge medications will be authorized once you are discharged, as it is imperative that you return to your primary care physician (or establish a relationship with a primary care physician if you do not have one) for your post hospital discharge needs so that they can reassess your need for medications and monitor your lab values.  Total Time spent coordinating discharge including counseling, education and face to face time equals 35 minutes.  Signed: Brance Dartt 02/28/2018 9:01 AM

## 2018-02-28 NOTE — Care Management Note (Signed)
Case Management Note  Patient Details  Name: Jenny Sexton MRN: [id number] Date of Birth: 1931/06/23  Subjective/Objective:     Right lower lobe lung mass with possible liver and osseous metastases. Hx of hypertension, dyslipidemia, extensive history of tobacco use        - Underwent ultrasound-guided liver biopsy on 12/3-    Renee Rich (Daughter) Dede Query (Daughter)     [phone]     PCP: Shon Baton          Action/Plan: Pt initially agreeable to SNF, however, has change with disposition and would like to go home with hospice care. Choice provided to pt / daughters and [facility] selected. Pt stated has used [facility] in the past for family members and was pleased. Referral made to [facility]/ Audrea Muscat for home hospice care. Audrea Muscat to see pt /daughters @ bedside soon, < hr.  Expected Discharge Date:  02/28/18               Expected Discharge Plan:  Skilled Nursing Facility  In-House Referral:  Clinical Social Work  Discharge planning Services  CM Consult  Post Acute Care Choice:  NA Choice offered to:  Patient, Adult Children  DME Arranged:  N/A DME Agency:  NA  HH Arranged:  Home hospice  [place] Agency:  [facility]  Status of Service:    If discussed at [place] of Stay Meetings, dates discussed:    Additional Comments:  Sharin Mons, RN 02/28/2018, 12:30 PM

## 2018-02-28 NOTE — Progress Notes (Signed)
Patient had an order to be D/C at [facility]. Per family, patient this AM is looking different (more confused) than yesterday. They said that during the night patient didn't void and she had a dark BM. Per MD order, staff did bladder scan which showed 125 ml urine. CBC was ordered. Will continue to monitor.

## 2018-02-28 NOTE — Care Management Important Message (Signed)
Important Message  Patient Details  Name: Jenny Sexton MRN: [id number] Date of Birth: [birth year]   Medicare Important Message Given:  Yes    [name] Circle 02/28/2018, 10:23 AM

## 2018-02-28 NOTE — Progress Notes (Addendum)
Plans were to discharge to SNF today-however family/daughter have now changed her mind-they take that the patient is deteriorating rather rapidly-patient is very weak and deconditioned-family now wants to take the patient home with hospice care.  They are not interested in pursuing chemotherapy at this time.  We will hold discharge today-have asked case management to offer hospice services, and to arrange for equipments that the family requires.  We will discharge hopefully tomorrow when everything is set up.  In the meantime-goal is for comfort-no further blood work-start as needed Roxanol and Ativan for comfort

## 2018-02-28 NOTE — Progress Notes (Signed)
Nutrition Brief Note  RD drawn to pt due to positive MST score.  Per MD noted this AM, pt declining rapidly and plan is to d/c home with hospice. Per MD, "in the meantime-goal is for comfort."  Chart reviewed. Pt now transitioning to comfort care.  No further nutrition interventions warranted at this time.  Please consult as needed.    Gaynell Face, MS, RD, LDN Inpatient Clinical Dietitian Pager: [phone] Weekend/After Hours: [phone]

## 2018-02-28 NOTE — Consult Note (Signed)
[facility] CM Primary Care Navigator  02/28/2018  Raylyn Carton Uplinger 11-Jun-1931 [id number]   Met with patientand daughters Ledell Noss and [place]) at the bedsideto identify possible discharge needs. Daughter reports thatpatient was weak, unsteady on feet, has weight loss, blood in stool and shortness of breath which all led to this admission. (symptomatic anemia, melena, right lower lobe lung mass, hypernatremia,hypokalemia, COPD, dysphagia, acute kidney injury, thrombocytopenia)  Patientendorses Dr. Shon Baton with [facility] as the primary care provider.    [facility].  Patientreports thatshe has beenmanagingher own medications at [employer] use of "pillbox" system filled once a week.  Patient's daughterverbalized thatpatient was driving prior to admission but her other daughter Lattie Haw) will be able to provide transportationtoher doctors' appointments after discharge.  Patientlives at home alone and was living independently prior to admission, but one of her daughters will be going to stay with her at home.    Anticipateddischargeplan isskilled nursing facility per therapy recommendation (SNF- in process). Patient's daughter mentioned that other daughter Lattie Haw) will be staying with patient at home once she is discharge and will serve as her primary caregiver.   Patientand daughters voiced understanding to call primary care provider's office whenshegetsback home, for a post discharge follow-up within1- 2weeksor sooner if needs arise. Patient letter (with PCP's contact number) was provided asareminder.   Discussed with patientand daughters regarding [facility] CM services available for health managementand resourcesat home andhavevoiced interest with services when she gets home. Prior plan was mentioned of seeking advise/ referral from primary care  provider to a pulmonary provider but was put on hold due to this admission.  Patient and daughtersplan to discuss with primary care provider on her next visit about further management of health issues after discharge to home.   Patient and daughters verbalizedunderstandingto seek referral from primary care provider to [facility] care management ifdeemed necessary and appropriatefor services in the nearfuture- when shegetsback home.  [facility] care management information was provided for future needsthatshe may have.  Primary care provider's office is listed as providing transition of care (TOC) follow-up.  Patient's RN notified of daughters' discharge concerns and will address with MD as stated.   For additional questions please contact:  Edwena Felty A. Malyssa Maris, BSN, RN-BC [facility] PRIMARY CARE Navigator Cell: [phone]

## 2018-02-28 NOTE — Progress Notes (Signed)
[facility])  Received request from [facility] for family interest in [facility] services at home after discharge. Chart has been reviewed and eligibility has been confirmed. RNCM aware.    Met with daughters Joseph Art and Lattie Haw and g-dtr Arelia Longest at bedside to explain services and answer questions. Family verbalized good understanding of information provided. DME discussed. Hospital bed, OBT, BSC, WC, oxygen set up ordered through [place] per family request. Discharge address has been confirmed as correct in chart. Per family plan is home via [facility] 03/17/2018.  Please send home with patient signed and completed out of facility DNR.   Please send home with patient scripts for comfort medications.   Please notify [facility] at time of discharge summary by calling [phone] between 8:30 and 5:00. Call [phone] after 5:00 pm.   Contact information given to family.   Please call with hospice related questions.  Thank you,  Erling Conte, LCSW [phone]

## 2018-02-28 NOTE — Progress Notes (Signed)
  Speech Language Pathology Treatment: Dysphagia  Patient Details Name: JENAYA SAAR MRN: [id number] DOB: Jan 05, 1932 Today's Date: 02/28/2018 Time: 1100-1420 SLP Time Calculation (min) (ACUTE ONLY): 200 min  Assessment / Plan / Recommendation Clinical Impression  Checked in with pt and family prior to change in plan of care. Granddaughter reported improvement in oral hygiene, which is visible. Pt able to accept liquids as seen yesterday, but with lots on encouragement. She simply does not have appetite and refuses all PO after a few bites/sips. No overt difficulty swallow observed. Given focus on comfort will complete orders as all SLP education is also complete.   HPI HPI: Tamarra S Chanthavong is a 84 y.o. female with past medical history significant for essential hypertension, hyperlipidemia, 60+ current tobacco user, history of colonic polyps and family history of colon cancer in her mother who presented to [facility] ED accompanied by her granddaughter with complaints of worsening shortness of breath and generalized weakness of 2 weeks duration.  Associated with dark stools and intermittent dizziness, patient reports she recently had a colonoscopy done at [facility] and it was unremarkable, also unintentional weight loss 10 to 20 pounds. 60+ current smoker. workup showed -heme positive stool, leukocytosis, anemia, ARF, CT showed - central right lower lobe bronchogenic neoplasm.      SLP Plan  Continue with current plan of care       Recommendations  Medication Administration: Whole meds with puree Supervision: Staff to assist with self feeding;Trained caregiver to feed patient Compensations: Minimize environmental distractions;Slow rate;Small sips/bites;Follow solids with liquid                Follow up Recommendations: 24 hour supervision/assistance SLP Visit Diagnosis: Dysphagia, oral phase (R13.11) Plan: Continue with current plan of care       GO                Deniya Craigo, Katherene Ponto 02/28/2018, 2:05 PM

## 2018-03-01 MED ORDER — ENSURE ENLIVE PO LIQD: 237.0000 mL | Freq: Two times a day (BID) | ORAL | 0 refills | Status: AC

## 2018-03-01 MED ORDER — LORAZEPAM 1 MG PO TABS: 1.0000 mg | ORAL_TABLET | ORAL | 0 refills | Status: AC | PRN

## 2018-03-01 MED ORDER — IPRATROPIUM-ALBUTEROL 0.5-2.5 (3) MG/3ML IN SOLN: 3.0000 mL | Freq: Four times a day (QID) | RESPIRATORY_TRACT | 0 refills | Status: AC | PRN

## 2018-03-01 MED ORDER — MORPHINE SULFATE (CONCENTRATE) 10 MG/0.5ML PO SOLN: 5.0000 mg | ORAL | 0 refills | Status: AC | PRN

## 2018-03-01 MED ORDER — NICOTINE 21 MG/24HR TD PT24: 21.0000 mg | MEDICATED_PATCH | Freq: Every day | TRANSDERMAL | 0 refills | Status: AC

## 2018-03-01 NOTE — Discharge Summary (Signed)
PATIENT DETAILS Name: Jenny Sexton Age: 84 y.o. Sex: female Date of Birth: 1931/06/04 MRN: [id number]. Admitting Physician: Kayleen Memos, DO OYD:XAJOI, Jenny Reichmann, MD  Admit Date: 02/16/2018 Discharge date: 03/03/2018  Recommendations for Outpatient Follow-up:  1. Follow up with PCP in 1-2 weeks 2. Being discharge with home hospice   Admitted From:  Home  Disposition: Home with home [place]: No  Equipment/Devices: None  Discharge Condition: Stable  CODE STATUS: DNR/ Comfort Care  Diet recommendation:  Dys 2 diet  Brief Summary: See H&P, Labs, Consult and Test reports for all details in brief, See H&P, Labs, Consult and Test reports for all details in brief, Patient is a84 y.o.femalewith history of hypertension, dyslipidemia, extensive history of tobacco use who presented with worsening generalized weakness,shortness of breath, intermittent dark-colored stools, and dizziness-she has had unintentional weight loss of approximately 20 pounds in the past few months-further work-up revealed a right lower lobe mass with mediastinal and hilar lymphadenopathy along with multiple hepatic masses. Underwent ultrasound-guided liver biopsy on 12/3-which showed small cell carcinoma. See below for further details  Brief Hospital Course: Right lower lobe lung mass with possible liver and osseous metastases:Underwent ultrasound-guided liver biopsy on 12/3-bx shows small cell carcinoma. Plans were to d/c to SNF with outpatient oncology follow up. However patient continued to slowly deteriorate, at this time-family now has elected to take patient home with hospice care, family has decided against pursuing chemotherapy. This MD had spoken with oncology on call-Dr. Alen Blew on 12/4-I sent a epic message over to his office-he will then let their navigator know to arrange outpatient follow-up-have asked grand-daughter at bedside today, to cancel the appt if they still are pursuing  comfort care-or to keep the appt if they change their mind.  Appears very frail and cachectic-overall prognosis is poor-and I suspect best served  by hospice care  Hypernatremia: Secondary to poor oral intake-resolved with gentle-patient with hypotonic saline.  Encouraged free water intake.   Hypokalemia:Repleted  Anemia:Suspect multifactorial-likely secondary to anemia chronic disease-has heme positive stools without any overt GI bleeding. Per prior notes-patient had a endoscopy done at [facility] recently that was unremarkable. Anemia panel consistent with anemia of chronic disease.  Thrombocytopenia:-This may be due to bone marrow involvement/osseous metastases from underlying lung cancer. Since platelet counts are improving-doubt further work-up is required  COPD: Appears stable-continue as needed bronchodilators.  Mild acute kidney injury: Creatinine downtrending-suspect may have developed some chronic kidney disease over the past few years as creatinine seems to have plateaued.  Elevated transaminases:Probably related to liver metastases.  Dysphagia: Evaluated by speech therapy on a dysphagia 2 diet.  Family aware of poor overall prognosis and risks of aspiration.  Procedures/Studies: None  Discharge Diagnoses:  Active Problems:   Hypercholesterolemia   Tobacco abuse   Symptomatic anemia   Melena   Right lower lobe lung mass   Discharge Instructions:  Activity:  As tolerated with Full fall precautions use walker/cane & assistance as needed   Discharge Instructions    Call MD for:  difficulty breathing, headache or visual disturbances   Complete by:  As directed    Discharge diet:   Complete by:  As directed    Dysphagia 2 (Fine chop);Thin liquid   Liquid Administration via: Cup;Straw Medication Administration: Whole meds with puree Supervision: Patient able to self feed Compensations: Minimize environmental distractions;Slow rate;Small  sips/bites;Follow solids with liquid Postural Changes: Seated upright at 90 degrees   Increase activity slowly   Complete  by:  As directed      Allergies as of 02/28/2018      Reactions   Amoxicillin    Codeine    nausea      Medication List    STOP taking these medications   atenolol 50 MG tablet Commonly known as:  TENORMIN   atorvastatin 10 MG tablet Commonly known as:  LIPITOR   triamterene-hydrochlorothiazide 37.5-25 MG capsule Commonly known as:  DYAZIDE     TAKE these medications   ALPHAGAN P 0.1 % Soln Generic drug:  brimonidine 1 drop both eyes twice a day   cetirizine 10 MG tablet Commonly known as:  ZYRTEC Take 10 mg by mouth daily.   dorzolamide-timolol 22.3-6.8 MG/ML ophthalmic solution Commonly known as:  COSOPT 1 drop 2 (two) times daily.   feeding supplement (ENSURE ENLIVE) Liqd Take 237 mLs by mouth 2 (two) times daily between meals.   ipratropium-albuterol 0.5-2.5 (3) MG/3ML Soln Commonly known as:  DUONEB Take 3 mLs by nebulization every 6 (six) hours as needed.   LORazepam 1 MG tablet Commonly known as:  ATIVAN Take 1 tablet (1 mg total) by mouth every 4 (four) hours as needed for anxiety or sedation (comfort).   morphine CONCENTRATE 10 MG/0.5ML Soln concentrated solution Take 0.25 mLs (5 mg total) by mouth every 2 (two) hours as needed for moderate pain, severe pain or shortness of breath (or dyspnea/comfort).   MULTIVITAMIN ADULT PO Take by mouth.   nicotine 21 mg/24hr patch Commonly known as:  NICODERM CQ - dosed in mg/24 hours Place 1 patch (21 mg total) onto the skin daily.   Travoprost (BAK Free) 0.004 % Soln ophthalmic solution Commonly known as:  TRAVATAN 1 drop at bedtime.   VENTOLIN HFA 108 (90 Base) MCG/ACT inhaler Generic drug:  albuterol Inhale 2 puffs into the lungs every 6 (six) hours as needed.       Contact information for follow-up providers    Shon Baton, MD. Schedule an appointment as soon as possible for a  visit.   Specialty:  Internal Medicine Why:  as needed Contact information: [place] [address] [phone]        [facility] Follow up.   Why:  office will call for a follow up appointment           Contact information for after-discharge care    Destination    [facility] .   Service:  Skilled Nursing Contact information: [address] [place] [phone]                 Allergies  Allergen Reactions  . Amoxicillin   . Codeine     nausea    Consultations:   IR   Other Procedures/Studies: Dg Chest 2 View  Result Date: 02/22/2018 CLINICAL DATA:  Generalized weakness with dizziness and shortness of breath 1 week. Smoker. EXAM: CHEST - 2 VIEW COMPARISON:  07/13/2011 FINDINGS: Lungs are somewhat hyperexpanded without focal airspace consolidation or effusion. Cardiomediastinal silhouette is within normal. There is prominence of the right hilum with somewhat lobulated density over the infrahilar region on the lateral film as findings may be due to adenopathy/mass. Remainder of the exam is unchanged. IMPRESSION: No acute cardiopulmonary disease. Increased prominence of the right hilum with possible adenopathy/mass in the infrahilar region on the lateral film. Recommend contrast-enhanced chest CT for further evaluation. Electronically Signed   By: Marin Olp M.D.   On: 02/15/2018 14:09  Ct Head Wo Contrast  Result Date: 02/26/2018 CLINICAL DATA:  Decreased mental status EXAM: CT HEAD WITHOUT CONTRAST TECHNIQUE: Contiguous axial images were obtained from the base of the skull through the vertex without intravenous contrast. COMPARISON:  None. FINDINGS: Brain: Atrophic changes are noted as well as mild chronic white matter ischemic change. No findings to suggest acute hemorrhage, acute infarction or space-occupying mass lesion are noted. Vascular: No hyperdense vessel or unexpected  calcification. Skull: Normal. Negative for fracture or focal lesion. Sinuses/Orbits: No acute finding. Other: None. IMPRESSION: Chronic atrophic and ischemic changes without acute abnormality. Electronically Signed   By: Inez Catalina M.D.   On: 02/26/2018 15:28   Ct Chest Wo Contrast  Result Date: 02/02/2018 CLINICAL DATA:  Generalized weakness which shortness-of-breath one week. Acute renal insufficiency. EXAM: CT CHEST WITHOUT CONTRAST TECHNIQUE: Multidetector CT imaging of the chest was performed following the standard protocol without IV contrast. COMPARISON:  CT abdomen/pelvis 05/03/2009 FINDINGS: Cardiovascular: Heart is normal size. Calcified plaque over the coronary arteries. Calcified plaque over the thoracic aorta. Remaining vascular structures are unremarkable. Mediastinum/Nodes: It is difficult to assess mediastinal and hilar adenopathy due to lack of intravenous contrast. There is moderate mediastinal adenopathy. There is a 2 cm precarinal lymph node and 1.6 cm AP window lymph node. 1.6 cm and 1.7 cm subcarinal lymph nodes. Likely mild right hilar adenopathy. Remaining mediastinal structures are unremarkable. Lungs/Pleura: Lungs are adequately inflated demonstrate moderate diffuse emphysematous disease. There is mild biapical pleural thickening. There are several small scattered bilateral subcentimeter calcified granulomas. No focal airspace process or effusion. There is a nodule over the right infrahilar region measuring 2 x 2.8 cm with moderate nodularity central over the airways of the central right lower lobe. The largest masslike area of nodularity measures 2.7 x 3.3 cm. Upper Abdomen: Mild hepatomegaly with somewhat nodular liver contour. 1.6 cm cyst over the upper pole left kidney. Calcified plaque over the abdominal aorta. Musculoskeletal: Degenerative change of the spine IMPRESSION: Moderate focal nodularity over the right lower lobe centered over the airways with the largest area of  nodularity measuring 2.7 x 3.3 cm. Central right lower lobe nodule abutting the inferior pulmonary vein measuring 2 x 2.8 cm. There is moderate mediastinal and likely right hilar adenopathy. These findings may be due to central right lower lobe bronchogenic neoplasm. Recommend thoracic surgery consultation. Moderate emphysematous disease and evidence of prior granulomas disease. Aortic Atherosclerosis (ICD10-I70.0). Atherosclerotic coronary artery disease. 1.6 cm left renal cyst. Mild hepatomegaly. Electronically Signed   By: Marin Olp M.D.   On: 02/20/2018 18:29   US Abdomen Complete  Result Date: 02/10/2018 CLINICAL DATA:  Transaminitis.  Thrombocytopenia. EXAM: ABDOMEN ULTRASOUND COMPLETE COMPARISON:  CT scan February 23, 2018 FINDINGS: Gallbladder: No gallstones or wall thickening visualized. No sonographic Murphy sign noted by sonographer. Common bile duct: Diameter: 2.9 mm Liver: The liver is heterogeneous with multiple apparent masses. A representative mass in the right hepatic lobe measures 5.5 x 4.9 x 5.6 cm. The liver is nodular in contour on today's CT of the chest raising the possibility of cirrhosis. Portal vein is patent on color Doppler imaging with normal direction of blood flow towards the liver. IVC: Poorly visualized but grossly unremarkable. Pancreas: Poorly visualized but grossly unremarkable. Spleen: Size and appearance within normal limits. Right Kidney: Length: 8.2 cm. Increased cortical echogenicity. Contains 2 cysts with the largest measuring 1.7 cm. No hydronephrosis. Left Kidney: Length: 8.4 cm. Contains 2 cysts with the largest measuring 3.2 cm. Increased cortical echogenicity.  Abdominal aorta: Atherosclerotic change in the nonaneurysmal aorta. Other findings: None. IMPRESSION: 1. Apparent hepatic masses. Heterogeneous echotexture in the liver. Nodular contour on the CT scan suggesting cirrhosis, not as well appreciated on this study. Recommend an MRI as an outpatient for  further evaluation of the liver and to evaluate the apparent underlying masses. 2. Small echogenic kidneys with cysts suggesting medical renal disease. 3. Atherosclerotic changes in the nonaneurysmal aorta. Electronically Signed   By: Dorise Bullion III M.D   On: 02/13/2018 20:13   Mr Liver W Wo Contrast  Result Date: 02/24/2018 CLINICAL DATA:  Liver masses on ultrasound from 1 day prior. Right lower lobe lung mass and mediastinal adenopathy on chest CT from 1 day prior. EXAM: MRI ABDOMEN WITHOUT AND WITH CONTRAST TECHNIQUE: Multiplanar multisequence MR imaging of the abdomen was performed both before and after the administration of intravenous contrast. CONTRAST:  4 cc Gadavist IV. COMPARISON:  02/06/2018 abdominal sonogram and chest CT. 05/03/2009 CT abdomen/pelvis. FINDINGS: Limited motion degraded scan with limited IV contrast enhancement. Lower chest: Right lower lobe 3.2 cm irregular lung mass is again demonstrated (series 15/image 80). Right hilar/infrahilar and subcarinal and paratracheal mediastinal adenopathy is partially visualized on this scan, better seen on chest CT from 1 day prior. Hepatobiliary: Liver is enlarged. No hepatic steatosis. There are innumerable similar confluent hypoenhancing liver masses replacing much of the liver parenchyma, all new since 05/03/2009 CT abdomen study, each demonstrating mild T2 hyperintensity and T1 hypointensity, compatible with widespread liver metastatic disease. Representative liver masses as follows: -segment 4B left liver lobe 8.0 x 5.3 cm mass (series 5/image 25) -segment 7 right liver lobe 5.0 x 4.2 cm mass (series 5/image 11) -segment 2 left liver lobe 4.5 x 3.4 cm mass (series 5/image 9) Normal gallbladder with no cholelithiasis. There are scattered regions of mild peripheral intrahepatic biliary ductal dilatation. Normal caliber common bile duct with diameter 4 mm. No choledocholithiasis. Pancreas: No pancreatic mass or duct dilation.  No pancreas  divisum. Spleen: Normal size. No mass. Adrenals/Urinary Tract: Normal adrenals. No hydronephrosis. Several simple renal cysts in both kidneys, largest an exophytic simple 2.9 cm lateral lower left renal cyst. Subcentimeter T1 hyperintense T2 hypointense renal cortical lesion in the posterior lower right kidney without appreciable enhancement, compatible with a tiny hemorrhagic/proteinaceous Bosniak category 2 renal cyst. Stomach/Bowel: Normal non-distended stomach. Visualized small and large bowel is normal caliber, with no bowel wall thickening. Vascular/Lymphatic: Nonaneurysmal abdominal aorta. Unable to assess venous patency given limited IV contrast enhancement. No pathologically enlarged lymph nodes in the abdomen. Other: Small volume right perihepatic/right pericolic gutter ascites. No focal fluid collection. Musculoskeletal: Widespread patchy osseous lesions throughout the visualized thoracolumbar spine and bilateral pelvic skeleton. IMPRESSION: 1. Irregular 3.2 cm right lower lobe lung mass, for which primary bronchogenic carcinoma is the diagnosis of exclusion. Partially visualized right hilar/infrahilar and subcarinal/paratracheal mediastinal adenopathy suspicious for nodal metastases. These findings were better depicted on the chest CT study from 01/25/2018. 2. Widespread liver metastases, enlarging the liver and replacing much of the liver parenchyma. 3. Scattered regions of peripheral intrahepatic biliary ductal dilatation throughout the liver with normal caliber CBD. 4. Widespread patchy osseous lesions throughout the visualized axial skeleton most compatible with widespread osseous metastases. 5. Small volume right perihepatic/right pericolic gutter ascites. Electronically Signed   By: Ilona Sorrel M.D.   On: 02/24/2018 16:25   US Biopsy (liver)  Result Date: 02/26/2018 INDICATION: Concern for metastatic lung cancer. Please perform ultrasound-guided liver lesion biopsy for tissue diagnostic  purposes. EXAM: ULTRASOUND GUIDED LIVER LESION BIOPSY COMPARISON:  Chest CT-02/21/2018; abdominal ultrasound-02/04/2018; abdominal MRI-02/24/2018 MEDICATIONS: None ANESTHESIA/SEDATION: Fentanyl 12.5 mcg IV; Versed 0.5 mg IV Total Moderate Sedation time:  13 Minutes. The patient's level of consciousness and vital signs were monitored continuously by radiology nursing throughout the procedure under my direct supervision. COMPLICATIONS: None immediate. PROCEDURE: Informed written consent was obtained from the patient after a discussion of the risks, benefits and alternatives to treatment. The patient understands and consents the procedure. A timeout was performed prior to the initiation of the procedure. Ultrasound scanning was performed of the right upper abdominal quadrant demonstrates multiple mixed echogenic lesions and masses scattered throughout the liver compatible with findings on preceding abdominal ultrasound and MRI. Note is made of a small amount of perihepatic ascites. An approximately 3.4 x 3.0 cm mass within the caudal aspect the right lobe of the liver correlating with the lesion seen on image 12, series 5 preceding abdominal MRI was targeted for biopsy given sonographic window and lesion location with interposed normal hepatic parenchyma. The procedure was planned. The right upper abdominal quadrant was prepped and draped in the usual sterile fashion. The overlying soft tissues were anesthetized with 1% lidocaine with epinephrine. A 17 gauge, 6.8 cm co-axial needle was advanced into a peripheral aspect of the lesion. This was followed by 5 core biopsies with an 18 gauge core device under direct ultrasound guidance. The coaxial needle tract was embolized with a small amount of Gel-Foam slurry and superficial hemostasis was obtained with manual compression. Post procedural scanning was negative for definitive area of hemorrhage or additional complication. A dressing was placed. The patient tolerated the  procedure well without immediate post procedural complication. IMPRESSION: Technically successful ultrasound guided core needle biopsy of indeterminate lesion within the caudal aspect of the right lobe of the liver. Electronically Signed   By: Sandi Mariscal M.D.   On: 02/26/2018 10:13     TODAY-DAY OF DISCHARGE:  Subjective:   Jenny Sexton today has no headache,no chest abdominal pain,no new weakness tingling or numbness, feels much better wants to go home today.   Objective:   Blood pressure 113/82, pulse (!) 118, temperature 97.7 F (36.5 C), resp. rate 20, weight 42.2 kg, SpO2 97 %.  Intake/Output Summary (Last 24 hours) at 03/07/2018 0828 Last data filed at 02/28/2018 0930 Gross per 24 hour  Intake 100 ml  Output -  Net 100 ml   Filed Weights   02/01/2018 2057 02/27/18 0648 02/28/18 0500  Weight: 39.6 kg 39.7 kg 42.2 kg    Exam: Awake Alert, Oriented *3, No new F.N deficits, Normal affect [place].AT,PERRAL Supple Neck,No JVD, No cervical lymphadenopathy appriciated.  Symmetrical Chest wall movement, Good air movement bilaterally, CTAB RRR,No Gallops,Rubs or new Murmurs, No Parasternal Heave +ve B.Sounds, Abd Soft, Non tender, No organomegaly appriciated, No rebound -guarding or rigidity. No Cyanosis, Clubbing or edema, No new Rash or bruise   PERTINENT RADIOLOGIC STUDIES: Dg Chest 2 View  Result Date: 02/22/2018 CLINICAL DATA:  Generalized weakness with dizziness and shortness of breath 1 week. Smoker. EXAM: CHEST - 2 VIEW COMPARISON:  07/13/2011 FINDINGS: Lungs are somewhat hyperexpanded without focal airspace consolidation or effusion. Cardiomediastinal silhouette is within normal. There is prominence of the right hilum with somewhat lobulated density over the infrahilar region on the lateral film as findings may be due to adenopathy/mass. Remainder of the exam is unchanged. IMPRESSION: No acute cardiopulmonary disease. Increased prominence of the right hilum with possible  adenopathy/mass in the  infrahilar region on the lateral film. Recommend contrast-enhanced chest CT for further evaluation. Electronically Signed   By: Marin Olp M.D.   On: 02/17/2018 14:09   Ct Head Wo Contrast  Result Date: 02/26/2018 CLINICAL DATA:  Decreased mental status EXAM: CT HEAD WITHOUT CONTRAST TECHNIQUE: Contiguous axial images were obtained from the base of the skull through the vertex without intravenous contrast. COMPARISON:  None. FINDINGS: Brain: Atrophic changes are noted as well as mild chronic white matter ischemic change. No findings to suggest acute hemorrhage, acute infarction or space-occupying mass lesion are noted. Vascular: No hyperdense vessel or unexpected calcification. Skull: Normal. Negative for fracture or focal lesion. Sinuses/Orbits: No acute finding. Other: None. IMPRESSION: Chronic atrophic and ischemic changes without acute abnormality. Electronically Signed   By: Inez Catalina M.D.   On: 02/26/2018 15:28   Ct Chest Wo Contrast  Result Date: 02/10/2018 CLINICAL DATA:  Generalized weakness which shortness-of-breath one week. Acute renal insufficiency. EXAM: CT CHEST WITHOUT CONTRAST TECHNIQUE: Multidetector CT imaging of the chest was performed following the standard protocol without IV contrast. COMPARISON:  CT abdomen/pelvis 05/03/2009 FINDINGS: Cardiovascular: Heart is normal size. Calcified plaque over the coronary arteries. Calcified plaque over the thoracic aorta. Remaining vascular structures are unremarkable. Mediastinum/Nodes: It is difficult to assess mediastinal and hilar adenopathy due to lack of intravenous contrast. There is moderate mediastinal adenopathy. There is a 2 cm precarinal lymph node and 1.6 cm AP window lymph node. 1.6 cm and 1.7 cm subcarinal lymph nodes. Likely mild right hilar adenopathy. Remaining mediastinal structures are unremarkable. Lungs/Pleura: Lungs are adequately inflated demonstrate moderate diffuse emphysematous disease. There  is mild biapical pleural thickening. There are several small scattered bilateral subcentimeter calcified granulomas. No focal airspace process or effusion. There is a nodule over the right infrahilar region measuring 2 x 2.8 cm with moderate nodularity central over the airways of the central right lower lobe. The largest masslike area of nodularity measures 2.7 x 3.3 cm. Upper Abdomen: Mild hepatomegaly with somewhat nodular liver contour. 1.6 cm cyst over the upper pole left kidney. Calcified plaque over the abdominal aorta. Musculoskeletal: Degenerative change of the spine IMPRESSION: Moderate focal nodularity over the right lower lobe centered over the airways with the largest area of nodularity measuring 2.7 x 3.3 cm. Central right lower lobe nodule abutting the inferior pulmonary vein measuring 2 x 2.8 cm. There is moderate mediastinal and likely right hilar adenopathy. These findings may be due to central right lower lobe bronchogenic neoplasm. Recommend thoracic surgery consultation. Moderate emphysematous disease and evidence of prior granulomas disease. Aortic Atherosclerosis (ICD10-I70.0). Atherosclerotic coronary artery disease. 1.6 cm left renal cyst. Mild hepatomegaly. Electronically Signed   By: Marin Olp M.D.   On: 02/10/2018 18:29   US Abdomen Complete  Result Date: 02/03/2018 CLINICAL DATA:  Transaminitis.  Thrombocytopenia. EXAM: ABDOMEN ULTRASOUND COMPLETE COMPARISON:  CT scan February 23, 2018 FINDINGS: Gallbladder: No gallstones or wall thickening visualized. No sonographic Murphy sign noted by sonographer. Common bile duct: Diameter: 2.9 mm Liver: The liver is heterogeneous with multiple apparent masses. A representative mass in the right hepatic lobe measures 5.5 x 4.9 x 5.6 cm. The liver is nodular in contour on today's CT of the chest raising the possibility of cirrhosis. Portal vein is patent on color Doppler imaging with normal direction of blood flow towards the liver. IVC:  Poorly visualized but grossly unremarkable. Pancreas: Poorly visualized but grossly unremarkable. Spleen: Size and appearance within normal limits. Right Kidney: Length: 8.2 cm. Increased cortical  echogenicity. Contains 2 cysts with the largest measuring 1.7 cm. No hydronephrosis. Left Kidney: Length: 8.4 cm. Contains 2 cysts with the largest measuring 3.2 cm. Increased cortical echogenicity. Abdominal aorta: Atherosclerotic change in the nonaneurysmal aorta. Other findings: None. IMPRESSION: 1. Apparent hepatic masses. Heterogeneous echotexture in the liver. Nodular contour on the CT scan suggesting cirrhosis, not as well appreciated on this study. Recommend an MRI as an outpatient for further evaluation of the liver and to evaluate the apparent underlying masses. 2. Small echogenic kidneys with cysts suggesting medical renal disease. 3. Atherosclerotic changes in the nonaneurysmal aorta. Electronically Signed   By: Dorise Bullion III M.D   On: 02/11/2018 20:13   Mr Liver W Wo Contrast  Result Date: 02/24/2018 CLINICAL DATA:  Liver masses on ultrasound from 1 day prior. Right lower lobe lung mass and mediastinal adenopathy on chest CT from 1 day prior. EXAM: MRI ABDOMEN WITHOUT AND WITH CONTRAST TECHNIQUE: Multiplanar multisequence MR imaging of the abdomen was performed both before and after the administration of intravenous contrast. CONTRAST:  4 cc Gadavist IV. COMPARISON:  01/31/2018 abdominal sonogram and chest CT. 05/03/2009 CT abdomen/pelvis. FINDINGS: Limited motion degraded scan with limited IV contrast enhancement. Lower chest: Right lower lobe 3.2 cm irregular lung mass is again demonstrated (series 15/image 80). Right hilar/infrahilar and subcarinal and paratracheal mediastinal adenopathy is partially visualized on this scan, better seen on chest CT from 1 day prior. Hepatobiliary: Liver is enlarged. No hepatic steatosis. There are innumerable similar confluent hypoenhancing liver masses replacing  much of the liver parenchyma, all new since 05/03/2009 CT abdomen study, each demonstrating mild T2 hyperintensity and T1 hypointensity, compatible with widespread liver metastatic disease. Representative liver masses as follows: -segment 4B left liver lobe 8.0 x 5.3 cm mass (series 5/image 25) -segment 7 right liver lobe 5.0 x 4.2 cm mass (series 5/image 11) -segment 2 left liver lobe 4.5 x 3.4 cm mass (series 5/image 9) Normal gallbladder with no cholelithiasis. There are scattered regions of mild peripheral intrahepatic biliary ductal dilatation. Normal caliber common bile duct with diameter 4 mm. No choledocholithiasis. Pancreas: No pancreatic mass or duct dilation.  No pancreas divisum. Spleen: Normal size. No mass. Adrenals/Urinary Tract: Normal adrenals. No hydronephrosis. Several simple renal cysts in both kidneys, largest an exophytic simple 2.9 cm lateral lower left renal cyst. Subcentimeter T1 hyperintense T2 hypointense renal cortical lesion in the posterior lower right kidney without appreciable enhancement, compatible with a tiny hemorrhagic/proteinaceous Bosniak category 2 renal cyst. Stomach/Bowel: Normal non-distended stomach. Visualized small and large bowel is normal caliber, with no bowel wall thickening. Vascular/Lymphatic: Nonaneurysmal abdominal aorta. Unable to assess venous patency given limited IV contrast enhancement. No pathologically enlarged lymph nodes in the abdomen. Other: Small volume right perihepatic/right pericolic gutter ascites. No focal fluid collection. Musculoskeletal: Widespread patchy osseous lesions throughout the visualized thoracolumbar spine and bilateral pelvic skeleton. IMPRESSION: 1. Irregular 3.2 cm right lower lobe lung mass, for which primary bronchogenic carcinoma is the diagnosis of exclusion. Partially visualized right hilar/infrahilar and subcarinal/paratracheal mediastinal adenopathy suspicious for nodal metastases. These findings were better depicted on the  chest CT study from 02/12/2018. 2. Widespread liver metastases, enlarging the liver and replacing much of the liver parenchyma. 3. Scattered regions of peripheral intrahepatic biliary ductal dilatation throughout the liver with normal caliber CBD. 4. Widespread patchy osseous lesions throughout the visualized axial skeleton most compatible with widespread osseous metastases. 5. Small volume right perihepatic/right pericolic gutter ascites. Electronically Signed   By: Janina Mayo.D.  On: 02/24/2018 16:25   US Biopsy (liver)  Result Date: 02/26/2018 INDICATION: Concern for metastatic lung cancer. Please perform ultrasound-guided liver lesion biopsy for tissue diagnostic purposes. EXAM: ULTRASOUND GUIDED LIVER LESION BIOPSY COMPARISON:  Chest CT-02/08/2018; abdominal ultrasound-01/31/2018; abdominal MRI-02/24/2018 MEDICATIONS: None ANESTHESIA/SEDATION: Fentanyl 12.5 mcg IV; Versed 0.5 mg IV Total Moderate Sedation time:  13 Minutes. The patient's level of consciousness and vital signs were monitored continuously by radiology nursing throughout the procedure under my direct supervision. COMPLICATIONS: None immediate. PROCEDURE: Informed written consent was obtained from the patient after a discussion of the risks, benefits and alternatives to treatment. The patient understands and consents the procedure. A timeout was performed prior to the initiation of the procedure. Ultrasound scanning was performed of the right upper abdominal quadrant demonstrates multiple mixed echogenic lesions and masses scattered throughout the liver compatible with findings on preceding abdominal ultrasound and MRI. Note is made of a small amount of perihepatic ascites. An approximately 3.4 x 3.0 cm mass within the caudal aspect the right lobe of the liver correlating with the lesion seen on image 12, series 5 preceding abdominal MRI was targeted for biopsy given sonographic window and lesion location with interposed normal hepatic  parenchyma. The procedure was planned. The right upper abdominal quadrant was prepped and draped in the usual sterile fashion. The overlying soft tissues were anesthetized with 1% lidocaine with epinephrine. A 17 gauge, 6.8 cm co-axial needle was advanced into a peripheral aspect of the lesion. This was followed by 5 core biopsies with an 18 gauge core device under direct ultrasound guidance. The coaxial needle tract was embolized with a small amount of Gel-Foam slurry and superficial hemostasis was obtained with manual compression. Post procedural scanning was negative for definitive area of hemorrhage or additional complication. A dressing was placed. The patient tolerated the procedure well without immediate post procedural complication. IMPRESSION: Technically successful ultrasound guided core needle biopsy of indeterminate lesion within the caudal aspect of the right lobe of the liver. Electronically Signed   By: Sandi Mariscal M.D.   On: 02/26/2018 10:13     PERTINENT LAB RESULTS: CBC: Recent Labs    02/27/18 0335 02/28/18 1012  WBC 15.3* 17.0*  HGB 9.5* 10.1*  HCT 30.6* 31.6*  PLT 71* 79*   CMET CMP     Component Value Date/Time   NA 143 02/28/2018 0544   K 3.6 02/28/2018 0544   CL 109 02/28/2018 0544   CO2 21 (L) 02/28/2018 0544   GLUCOSE 125 (H) 02/28/2018 0544   BUN 68 (H) 02/28/2018 0544   CREATININE 1.50 (H) 02/28/2018 0544   CALCIUM 8.4 (L) 02/28/2018 0544   PROT 4.8 (L) 02/27/2018 0335   ALBUMIN 2.5 (L) 02/27/2018 0335   AST 189 (H) 02/27/2018 0335   ALT 185 (H) 02/27/2018 0335   ALKPHOS 252 (H) 02/27/2018 0335   BILITOT 1.8 (H) 02/27/2018 0335   GFRNONAA 32 (L) 02/28/2018 0544   GFRAA 37 (L) 02/28/2018 0544    GFR CrCl cannot be calculated (Unknown ideal weight.). No results for input(s): LIPASE, AMYLASE in the last 72 hours. No results for input(s): CKTOTAL, CKMB, CKMBINDEX, TROPONINI in the last 72 hours. Invalid input(s): POCBNP No results for input(s): DDIMER  in the last 72 hours. No results for input(s): HGBA1C in the last 72 hours. No results for input(s): CHOL, HDL, LDLCALC, TRIG, CHOLHDL, LDLDIRECT in the last 72 hours. No results for input(s): TSH, T4TOTAL, T3FREE, THYROIDAB in the last 72 hours.  Invalid input(s): FREET3 No  results for input(s): VITAMINB12, FOLATE, FERRITIN, TIBC, IRON, RETICCTPCT in the last 72 hours. Coags: No results for input(s): INR in the last 72 hours.  Invalid input(s): PT Microbiology: Recent Results (from the past 240 hour(s))  Respiratory Panel by PCR     Status: None   Collection Time: 02/03/2018  6:47 PM  Result Value Ref Range Status   Adenovirus NOT DETECTED NOT DETECTED Final   Coronavirus 229E NOT DETECTED NOT DETECTED Final   Coronavirus HKU1 NOT DETECTED NOT DETECTED Final   Coronavirus NL63 NOT DETECTED NOT DETECTED Final   Coronavirus OC43 NOT DETECTED NOT DETECTED Final   Metapneumovirus NOT DETECTED NOT DETECTED Final   Rhinovirus / Enterovirus NOT DETECTED NOT DETECTED Final   Influenza A NOT DETECTED NOT DETECTED Final   Influenza B NOT DETECTED NOT DETECTED Final   Parainfluenza Virus 1 NOT DETECTED NOT DETECTED Final   Parainfluenza Virus 2 NOT DETECTED NOT DETECTED Final   Parainfluenza Virus 3 NOT DETECTED NOT DETECTED Final   Parainfluenza Virus 4 NOT DETECTED NOT DETECTED Final   Respiratory Syncytial Virus NOT DETECTED NOT DETECTED Final   Bordetella pertussis NOT DETECTED NOT DETECTED Final   Chlamydophila pneumoniae NOT DETECTED NOT DETECTED Final   Mycoplasma pneumoniae NOT DETECTED NOT DETECTED Final    Comment: Performed at [facility], [address], [place], [place] [address]    FURTHER DISCHARGE INSTRUCTIONS:  Get Medicines reviewed and adjusted: Please take all your medications with you for your next visit with your Primary MD  Laboratory/radiological data: Please request your Primary MD to go over all hospital tests and procedure/radiological results at the  follow up, please ask your Primary MD to get all Hospital records sent to his/her office.  In some cases, they will be blood work, cultures and biopsy results pending at the time of your discharge. Please request that your primary care M.D. goes through all the records of your hospital data and follows up on these results.  Also Note the following: If you experience worsening of your admission symptoms, develop shortness of breath, life threatening emergency, suicidal or homicidal thoughts you must seek medical attention immediately by calling 911 or calling your MD immediately  if symptoms less severe.  You must read complete instructions/literature along with all the possible adverse reactions/side effects for all the Medicines you take and that have been prescribed to you. Take any new Medicines after you have completely understood and accpet all the possible adverse reactions/side effects.   Do not drive when taking Pain medications or sleeping medications (Benzodaizepines)  Do not take more than prescribed Pain, Sleep and Anxiety Medications. It is not advisable to combine anxiety,sleep and pain medications without talking with your primary care practitioner  Special Instructions: If you have smoked or chewed Tobacco  in the last 2 yrs please stop smoking, stop any regular Alcohol  and or any Recreational drug use.  Wear Seat belts while driving.  Please note: You were cared for by a hospitalist during your hospital stay. Once you are discharged, your primary care physician will handle any further medical issues. Please note that NO REFILLS for any discharge medications will be authorized once you are discharged, as it is imperative that you return to your primary care physician (or establish a relationship with a primary care physician if you do not have one) for your post hospital discharge needs so that they can reassess your need for medications and monitor your lab values.  Total Time  spent coordinating discharge including counseling, education and face to face time equals 35 minutes.  SignedOren Binet 03/14/2018 8:28 AM

## 2018-03-01 NOTE — Progress Notes (Signed)
NCM confirmed with granddaughter @ bedside DME has been delivered to home. Pt ready to go home. NCM called and arranged transportation to home with [facility]. Nurse made aware. Whitman Hero RN,BSN,CM

## 2018-03-01 NOTE — Progress Notes (Signed)
Patient was discharged home with hospice by MD order; discharged instructions  review and give to patient and her daughter with care notes and prescriptions; IV DIC; patient will be transported to her house via [place].

## 2018-03-01 NOTE — Progress Notes (Signed)
PT Cancellation Note  Patient Details Name: Jenny Sexton MRN: [id number] DOB: October 04, 1931   Cancelled Treatment:    Reason Eval/Treat Not Completed: Patient declined, no reason specified. Pt and family member declining at this time. Plan is for pt to d/c home with Hospice today.    [place] 03/25/2018, 9:19 AM

## 2018-03-06 ENCOUNTER — Telehealth: Payer: Self-pay | Admitting: *Deleted

## 2018-03-06 NOTE — Telephone Encounter (Signed)
Oncology Nurse Navigator Documentation  Oncology Nurse Navigator Flowsheets 03/06/2018  Navigator Location [facility]  Navigator Encounter Type Telephone/I received referral and I called to schedule her.  I was updated that patient has passed.   Telephone Outgoing Call  Acuity Level 1  Time Spent with Patient 15

## 2018-03-27 DEATH — deceased

## 2019-08-03 IMAGING — MR MR ABDOMEN WO/W CM
15 of 22 series · 32 of 48 positions shown · IV contrast (gadavist)
Comparison: 02/23/2018 abdominal sonogram and chest CT. 05/03/2009
CT abdomen/pelvis.

CLINICAL DATA: Liver masses on ultrasound from 1 day prior. Right
lower lobe lung mass and mediastinal adenopathy on chest CT from 1
day prior.

EXAM:
MRI ABDOMEN WITHOUT AND WITH CONTRAST
TECHNIQUE: Multiplanar multisequence MR imaging of the abdomen was performed
both before and after the administration of intravenous contrast.
CONTRAST:  4 cc Gadavist IV.

[Series 4: cor ssfse nav · coronal · 6.0mm · 0.78mm/px · 1 of 34 slices shown]
[im 1/34]
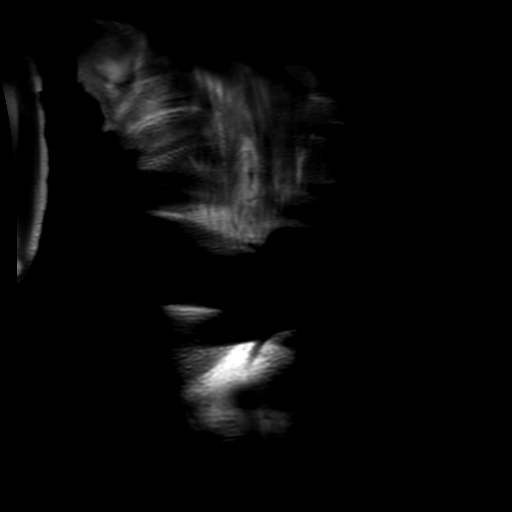

[Series 5: ax ssfse nav · axial · 6.0mm · 0.74mm/px · 1 of 40 slices shown]
[im 1/40]
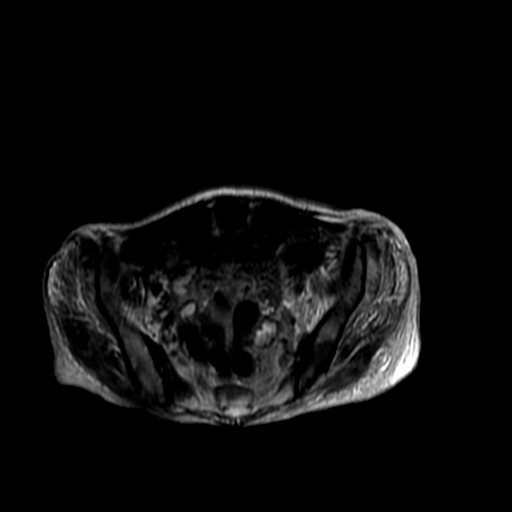

[Series 6: DWI b500 · axial · 8.0mm · 1.95mm/px · 1 of 54 slices shown]
[im 1/54]
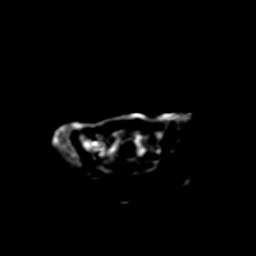

[Series 7: T2 fat-sat · axial · 6.0mm · 0.74mm/px · 1 of 35 slices shown]
[im 1/35]
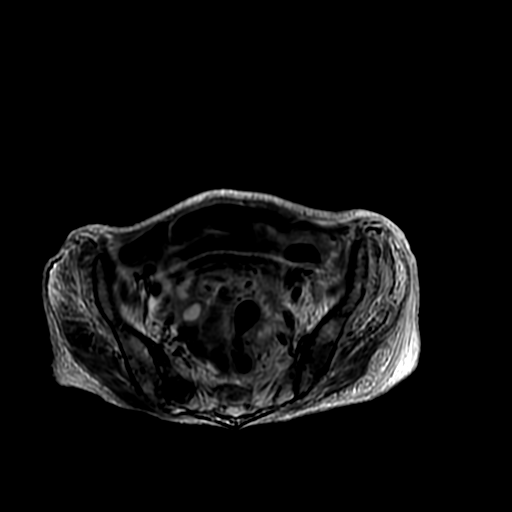

[Series 9: radial 2d thick · coronal · 40.0mm · 0.86mm/px · 1 of 6 slices shown]
[im 1/6]
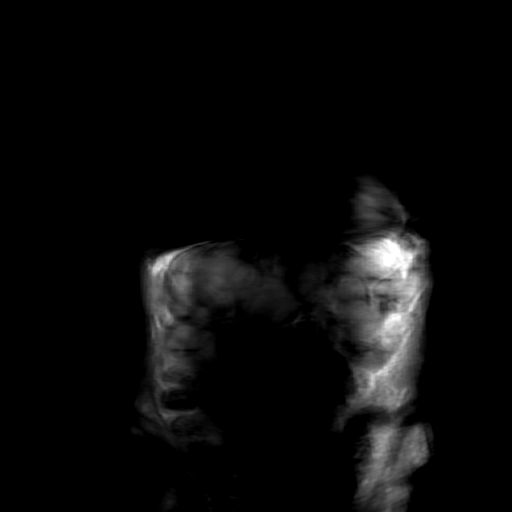

[Series 650: ADC · axial · 8.0mm · 1.95mm/px · 1 of 27 slices shown]
[im 1/27]
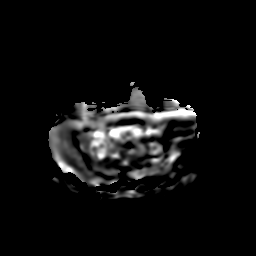

[Series 1301: T1 dynamic post-contrast · axial · non-contrast · 4.0mm · 1.48mm/px · z∈[-192,+45]mm · 3 of 120 slices shown (1 of 5)]
[im 1/120]
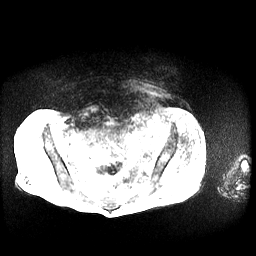
[im 60/120]
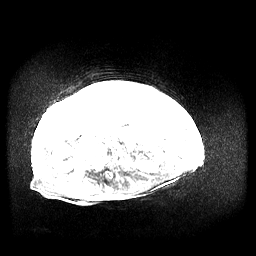
[im 120/120]
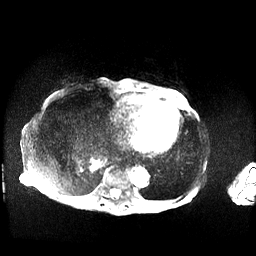

[Series 1302: T1 dynamic post-contrast · axial · non-contrast · 4.0mm · 1.48mm/px · z∈[-192,+45]mm · 3 of 120 slices shown (2 of 5)]
[im 1/120]
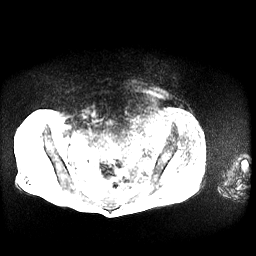
[im 60/120]
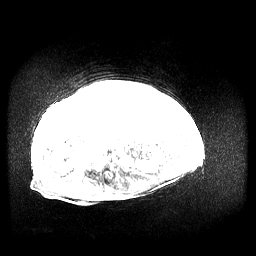
[im 120/120]
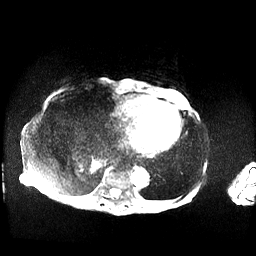

[Series 1303: T1 dynamic post-contrast · axial · non-contrast · 4.0mm · 1.48mm/px · z∈[-192,+45]mm · 3 of 120 slices shown (3 of 5)]
[im 1/120]
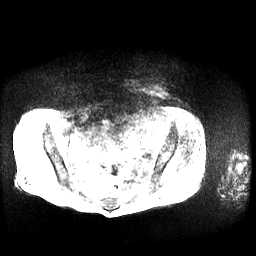
[im 60/120]
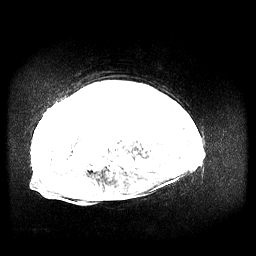
[im 120/120]
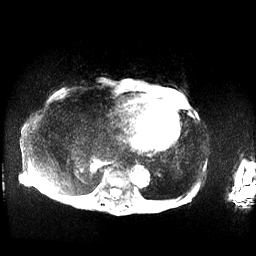

[Series 1304: T1 dynamic post-contrast · axial · non-contrast · 4.0mm · 1.48mm/px · z∈[-192,+45]mm · 3 of 120 slices shown (4 of 5)]
[im 1/120]
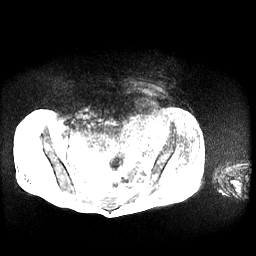
[im 60/120]
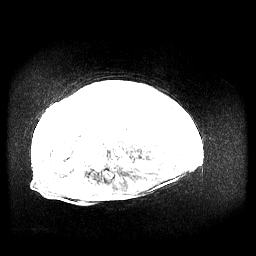
[im 120/120]
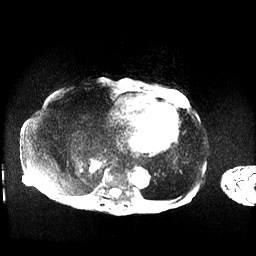

[Series 1305: T1 dynamic post-contrast · axial · non-contrast · 20.0mm · 2.57mm/px · z∈[-87,+274]mm · 2 of 95 slices shown (5 of 5)]
[im 1/95]
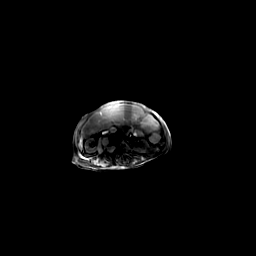
[im 95/95]
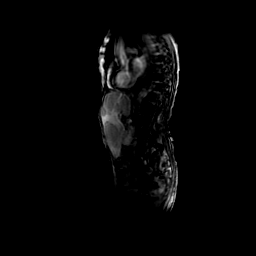

[((id)/(id)/1)-((id)/(id)/1) · axial · 4.0mm · 1.48mm/px · z∈[-192,+45]mm · 3 of 120 slices shown (1 of 4)]
[im 1/120]
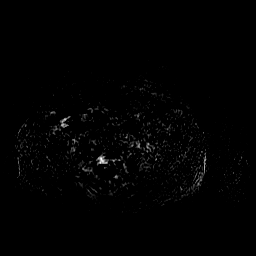
[im 60/120]
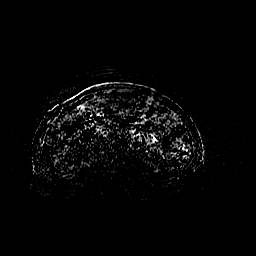
[im 120/120]
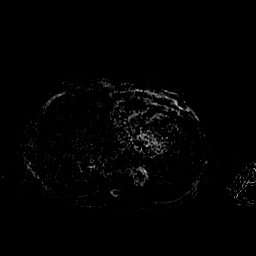

[((id)/(id)/1)-((id)/(id)/1) · axial · 4.0mm · 1.48mm/px · z∈[-192,+45]mm · 3 of 120 slices shown (2 of 4)]
[im 1/120]
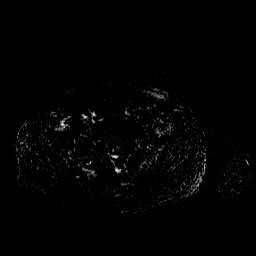
[im 60/120]
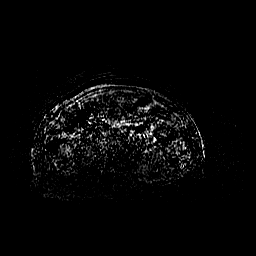
[im 120/120]
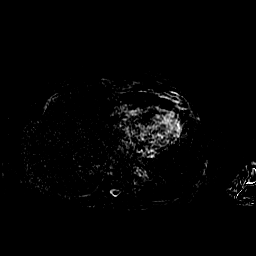

[((id)/(id)/1)-((id)/(id)/1) · axial · 4.0mm · 1.48mm/px · z∈[-192,+45]mm · 3 of 120 slices shown (3 of 4)]
[im 1/120]
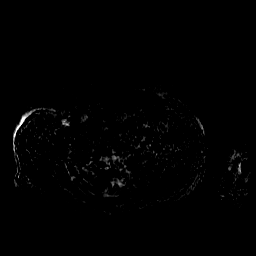
[im 60/120]
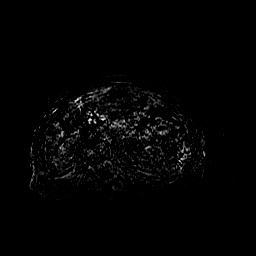
[im 120/120]
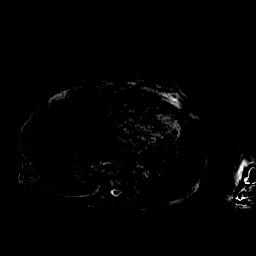

[((id)/(id)/1)-((id)/(id)/1) · axial · 4.0mm · 1.48mm/px · z∈[-192,+45]mm · 3 of 120 slices shown (4 of 4)]
[im 1/120]
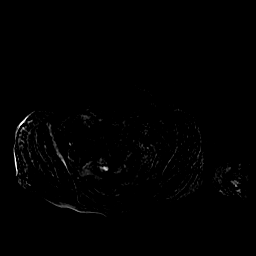
[im 60/120]
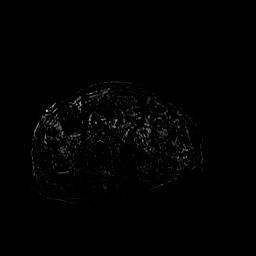
[im 120/120]
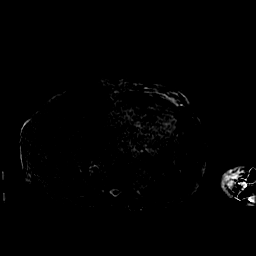

[32 of 48 positions shown; findings below may reference images not displayed]

FINDINGS: Limited motion degraded scan with limited IV contrast enhancement.

Lower chest: Right lower lobe 3.2 cm irregular lung mass is again
demonstrated (series 15/image 80). Right hilar/infrahilar and
subcarinal and paratracheal mediastinal adenopathy is partially
visualized on this scan, better seen on chest CT from 1 day prior.

Hepatobiliary: Liver is enlarged. No hepatic steatosis. There are
innumerable similar confluent hypoenhancing liver masses replacing
much of the liver parenchyma, all new since 05/03/2009 CT abdomen
study, each demonstrating mild T2 hyperintensity and T1
hypointensity, compatible with widespread liver metastatic disease.
Representative liver masses as follows:

-segment 4B left liver lobe 8.0 x 5.3 cm mass (series 5/image 25)

-segment 7 right liver lobe 5.0 x 4.2 cm mass (series 5/image 11)

-segment 2 left liver lobe 4.5 x 3.4 cm mass (series 5/image 9)

Normal gallbladder with no cholelithiasis. There are scattered
regions of mild peripheral intrahepatic biliary ductal dilatation.
Normal caliber common bile duct with diameter 4 mm. No
choledocholithiasis.

Pancreas: No pancreatic mass or duct dilation.  No pancreas divisum.

Spleen: Normal size. No mass.

Adrenals/Urinary Tract: Normal adrenals. No hydronephrosis. Several
simple renal cysts in both kidneys, largest an exophytic simple
cm lateral lower left renal cyst. Subcentimeter T1 hyperintense T2
hypointense renal cortical lesion in the posterior lower right
kidney without appreciable enhancement, compatible with a tiny
hemorrhagic/proteinaceous Bosniak category 2 renal cyst.

Stomach/Bowel: Normal non-distended stomach. Visualized small and
large bowel is normal caliber, with no bowel wall thickening.

Vascular/Lymphatic: Nonaneurysmal abdominal aorta. Unable to assess
venous patency given limited IV contrast enhancement. No
pathologically enlarged lymph nodes in the abdomen.

Other: Small volume right perihepatic/right pericolic gutter
ascites. No focal fluid collection.

Musculoskeletal: Widespread patchy osseous lesions throughout the
visualized thoracolumbar spine and bilateral pelvic skeleton.
IMPRESSION: 1. Irregular 3.2 cm right lower lobe lung mass, for which primary
bronchogenic carcinoma is the diagnosis of exclusion. Partially
visualized right hilar/infrahilar and subcarinal/paratracheal
mediastinal adenopathy suspicious for nodal metastases. These
findings were better depicted on the chest CT study from 02/23/2018.
2. Widespread liver metastases, enlarging the liver and replacing
much of the liver parenchyma.
3. Scattered regions of peripheral intrahepatic biliary ductal
dilatation throughout the liver with normal caliber CBD.
4. Widespread patchy osseous lesions throughout the visualized axial
skeleton most compatible with widespread osseous metastases.
5. Small volume right perihepatic/right pericolic gutter ascites.
